# Patient Record
Sex: Female | Born: 1970 | Race: Black or African American | Hispanic: No | Marital: Married | Smoking: Current every day smoker
Health system: Southern US, Community
[De-identification: ages and names within clinical notes are randomized; demographics above are authoritative.]

## PROBLEM LIST (undated history)

## (undated) DIAGNOSIS — R7303 Prediabetes: Secondary | ICD-10-CM

## (undated) DIAGNOSIS — M2550 Pain in unspecified joint: Secondary | ICD-10-CM

## (undated) DIAGNOSIS — G4730 Sleep apnea, unspecified: Secondary | ICD-10-CM

## (undated) DIAGNOSIS — R600 Localized edema: Secondary | ICD-10-CM

## (undated) DIAGNOSIS — M549 Dorsalgia, unspecified: Secondary | ICD-10-CM

## (undated) DIAGNOSIS — M5430 Sciatica, unspecified side: Secondary | ICD-10-CM

## (undated) DIAGNOSIS — A64 Unspecified sexually transmitted disease: Secondary | ICD-10-CM

## (undated) HISTORY — DX: Dorsalgia, unspecified: M54.9

## (undated) HISTORY — DX: Pain in unspecified joint: M25.50

## (undated) HISTORY — DX: Unspecified sexually transmitted disease: A64

## (undated) HISTORY — DX: Sleep apnea, unspecified: G47.30

## (undated) HISTORY — PX: WISDOM TOOTH EXTRACTION: SHX21

## (undated) HISTORY — DX: Localized edema: R60.0

## (undated) HISTORY — PX: ABLATION: SHX5711

---

## 1995-06-09 HISTORY — PX: BREAST SURGERY: SHX581

## 1998-01-15 ENCOUNTER — Other Ambulatory Visit: Admission: RE | Admit: 1998-01-15 | Discharge: 1998-01-15 | Payer: Self-pay | Admitting: Gynecology

## 1998-03-25 ENCOUNTER — Emergency Department (HOSPITAL_COMMUNITY): Admission: EM | Admit: 1998-03-25 | Discharge: 1998-03-25 | Payer: Self-pay | Admitting: Emergency Medicine

## 1998-03-29 ENCOUNTER — Ambulatory Visit (HOSPITAL_COMMUNITY): Admission: RE | Admit: 1998-03-29 | Discharge: 1998-03-29 | Payer: Self-pay | Admitting: Gynecology

## 1998-04-01 ENCOUNTER — Ambulatory Visit (HOSPITAL_COMMUNITY): Admission: RE | Admit: 1998-04-01 | Discharge: 1998-04-01 | Payer: Self-pay | Admitting: Gynecology

## 1999-09-01 ENCOUNTER — Emergency Department (HOSPITAL_COMMUNITY): Admission: EM | Admit: 1999-09-01 | Discharge: 1999-09-01 | Payer: Self-pay | Admitting: Emergency Medicine

## 2000-06-03 ENCOUNTER — Emergency Department (HOSPITAL_COMMUNITY): Admission: EM | Admit: 2000-06-03 | Discharge: 2000-06-03 | Payer: Self-pay | Admitting: *Deleted

## 2000-07-20 ENCOUNTER — Other Ambulatory Visit: Admission: RE | Admit: 2000-07-20 | Discharge: 2000-07-20 | Payer: Self-pay | Admitting: Obstetrics and Gynecology

## 2000-09-09 ENCOUNTER — Other Ambulatory Visit: Admission: RE | Admit: 2000-09-09 | Discharge: 2000-09-09 | Payer: Self-pay | Admitting: Plastic Surgery

## 2000-09-09 ENCOUNTER — Encounter (INDEPENDENT_AMBULATORY_CARE_PROVIDER_SITE_OTHER): Payer: Self-pay | Admitting: Specialist

## 2000-11-22 ENCOUNTER — Inpatient Hospital Stay (HOSPITAL_COMMUNITY): Admission: AD | Admit: 2000-11-22 | Discharge: 2000-11-22 | Payer: Self-pay | Admitting: Obstetrics and Gynecology

## 2001-05-13 ENCOUNTER — Observation Stay (HOSPITAL_COMMUNITY): Admission: EM | Admit: 2001-05-13 | Discharge: 2001-05-13 | Payer: Self-pay | Admitting: Emergency Medicine

## 2003-01-05 ENCOUNTER — Encounter: Payer: Self-pay | Admitting: Family Medicine

## 2006-01-21 ENCOUNTER — Ambulatory Visit: Payer: Self-pay | Admitting: Family Medicine

## 2006-01-21 ENCOUNTER — Encounter (INDEPENDENT_AMBULATORY_CARE_PROVIDER_SITE_OTHER): Payer: Self-pay | Admitting: *Deleted

## 2006-01-21 ENCOUNTER — Other Ambulatory Visit: Admission: RE | Admit: 2006-01-21 | Discharge: 2006-01-21 | Payer: Self-pay | Admitting: Family Medicine

## 2007-02-23 ENCOUNTER — Ambulatory Visit: Payer: Self-pay | Admitting: Family Medicine

## 2007-03-02 LAB — CONVERTED CEMR LAB
ALT: 22 units/L (ref 0–35)
Bilirubin, Direct: 0.1 mg/dL (ref 0.0–0.3)
CO2: 27 meq/L (ref 19–32)
Calcium: 8.8 mg/dL (ref 8.4–10.5)
Eosinophils Absolute: 0.2 10*3/uL (ref 0.0–0.6)
Eosinophils Relative: 2.3 % (ref 0.0–5.0)
GFR calc Af Amer: 104 mL/min
GFR calc non Af Amer: 86 mL/min
Glucose, Bld: 92 mg/dL (ref 70–99)
HCT: 39.7 % (ref 36.0–46.0)
HDL: 29.6 mg/dL — ABNORMAL LOW (ref >39.0)
Hemoglobin: 12.6 g/dL (ref 12.0–15.0)
MCV: 67.9 fL — ABNORMAL LOW (ref 78.0–100.0)
Monocytes Absolute: 0.5 10*3/uL (ref 0.2–0.7)
Neutrophils Relative %: 56.9 % (ref 43.0–77.0)
Platelets: 366 10*3/uL (ref 150–400)
Potassium: 3.7 meq/L (ref 3.5–5.1)
RDW: 15.8 % — ABNORMAL HIGH (ref 11.5–14.6)
Sodium: 143 meq/L (ref 135–145)
VLDL: 14 mg/dL (ref 0–40)

## 2007-03-03 ENCOUNTER — Telehealth (INDEPENDENT_AMBULATORY_CARE_PROVIDER_SITE_OTHER): Payer: Self-pay | Admitting: *Deleted

## 2007-03-28 ENCOUNTER — Ambulatory Visit: Payer: Self-pay | Admitting: Family Medicine

## 2007-03-28 ENCOUNTER — Other Ambulatory Visit: Admission: RE | Admit: 2007-03-28 | Discharge: 2007-03-28 | Payer: Self-pay | Admitting: Family Medicine

## 2007-03-28 ENCOUNTER — Encounter: Payer: Self-pay | Admitting: Family Medicine

## 2007-04-04 ENCOUNTER — Telehealth (INDEPENDENT_AMBULATORY_CARE_PROVIDER_SITE_OTHER): Payer: Self-pay | Admitting: *Deleted

## 2007-04-05 ENCOUNTER — Encounter: Payer: Self-pay | Admitting: Family Medicine

## 2007-04-06 ENCOUNTER — Telehealth: Payer: Self-pay | Admitting: Family Medicine

## 2007-07-06 ENCOUNTER — Encounter: Payer: Self-pay | Admitting: Family Medicine

## 2007-07-07 ENCOUNTER — Ambulatory Visit (HOSPITAL_COMMUNITY): Admission: RE | Admit: 2007-07-07 | Discharge: 2007-07-07 | Payer: Self-pay | Admitting: Surgery

## 2007-07-13 ENCOUNTER — Ambulatory Visit (HOSPITAL_COMMUNITY): Admission: RE | Admit: 2007-07-13 | Discharge: 2007-07-13 | Payer: Self-pay | Admitting: Surgery

## 2007-07-20 ENCOUNTER — Encounter: Admission: RE | Admit: 2007-07-20 | Discharge: 2007-07-20 | Payer: Self-pay | Admitting: Surgery

## 2007-08-15 ENCOUNTER — Encounter: Payer: Self-pay | Admitting: Family Medicine

## 2007-09-02 ENCOUNTER — Encounter: Payer: Self-pay | Admitting: Family Medicine

## 2007-12-01 ENCOUNTER — Encounter: Admission: RE | Admit: 2007-12-01 | Discharge: 2008-02-28 | Payer: Self-pay | Admitting: Surgery

## 2007-12-19 ENCOUNTER — Ambulatory Visit (HOSPITAL_COMMUNITY): Admission: RE | Admit: 2007-12-19 | Discharge: 2007-12-20 | Payer: Self-pay | Admitting: Surgery

## 2008-05-25 ENCOUNTER — Encounter: Payer: Self-pay | Admitting: Family Medicine

## 2008-06-08 HISTORY — PX: LAPAROSCOPIC GASTRIC BANDING: SHX1100

## 2009-10-07 ENCOUNTER — Ambulatory Visit: Payer: Self-pay | Admitting: Family Medicine

## 2009-10-07 ENCOUNTER — Encounter (INDEPENDENT_AMBULATORY_CARE_PROVIDER_SITE_OTHER): Payer: Self-pay | Admitting: *Deleted

## 2009-10-07 ENCOUNTER — Other Ambulatory Visit: Admission: RE | Admit: 2009-10-07 | Discharge: 2009-10-07 | Payer: Self-pay | Admitting: Family Medicine

## 2009-10-07 DIAGNOSIS — Z9884 Bariatric surgery status: Secondary | ICD-10-CM

## 2009-10-07 DIAGNOSIS — N898 Other specified noninflammatory disorders of vagina: Secondary | ICD-10-CM | POA: Insufficient documentation

## 2009-10-07 LAB — CONVERTED CEMR LAB
Bilirubin Urine: NEGATIVE
Nitrite: NEGATIVE
Protein, U semiquant: NEGATIVE
Urobilinogen, UA: 0.2
WBC Urine, dipstick: NEGATIVE

## 2009-10-10 ENCOUNTER — Encounter (INDEPENDENT_AMBULATORY_CARE_PROVIDER_SITE_OTHER): Payer: Self-pay | Admitting: *Deleted

## 2009-10-10 LAB — CONVERTED CEMR LAB: Pap Smear: NEGATIVE

## 2009-11-11 ENCOUNTER — Ambulatory Visit: Payer: Self-pay | Admitting: Family Medicine

## 2010-06-29 ENCOUNTER — Encounter: Payer: Self-pay | Admitting: Family Medicine

## 2010-07-08 NOTE — Letter (Signed)
Summary: Results Follow up Letter  [place] [facility]  [address] [place], [place] [address]   Phone: [phone]  Fax: [phone]    10/10/2009 MRN: [id number]  [facility] [place] [address]  # [place], [place]  [address]  Dear Ms. Griffiths,  The following are the results of your recent test(s):  Test         Result    Pap Smear:        Normal __X___  Not Normal _____ Comments: ______________________________________________________ Cholesterol: LDL(Bad cholesterol):         Your goal is less than:         HDL (Good cholesterol):       Your goal is more than: Comments:  ______________________________________________________ Mammogram:        Normal _____  Not Normal _____ Comments:  ___________________________________________________________________ Hemoccult:        Normal _____  Not normal _______ Comments:    _____________________________________________________________________ Other Tests:    We routinely do not discuss normal results over the telephone.  If you desire a copy of the results, or you have any questions about this information we can discuss them at your next office visit.   Sincerely,    [name] CMA  Oct 10, 2009 9:14 AM

## 2010-07-08 NOTE — Assessment & Plan Note (Signed)
Summary: pap/cpx/kdc   Vital Signs:  Patient profile:   40 year old female Height:      67.75 inches Weight:      279 pounds BMI:     42.89 Pulse rate:   87 / minute Pulse rhythm:   regular BP sitting:   126 / 80  (left arm) Cuff size:   regular  Vitals Entered By: [name] CMA (Oct 07, 2009 1:01 PM) CC: Pt here for CPX, pap   History of Present Illness: Pt here for cpe, pap and labs.  Pt with no complaints.  Pt needs to f/u surgeon for lap band.    Preventive Screening-Counseling & Management  Alcohol-Tobacco     Alcohol drinks/day: <1     Alcohol type: wine     Smoking Status: current     Smoking Cessation Counseling: yes     Smoke Cessation Stage: ready     Packs/Day: 0.25     Year Started: 1987     Passive Smoke Exposure: no  Caffeine-Diet-Exercise     Caffeine use/day: 1     Does Patient Exercise: yes     Type of exercise: walking     Times/week: 4  Hep-HIV-STD-Contraception     HIV Risk: no     Dental Visit-last 6 months yes     SBE monthly: yes     SBE Education/Counseling: to perform regular SBE  Safety-Violence-Falls     Seat Belt Use: 100      Sexual History:  currently monogamous.    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 Family History Hypertension Family History Heart disease  Social History: Reviewed history from 02/23/2007 and no changes required. 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 Eyes:  Denies blurring, discharge, double vision, eye irritation, eye pain, halos, itching, light sensitivity, red eye, vision loss-1 eye, and vision loss-both eyes; optho--no. ENT:  Denies decreased hearing, difficulty swallowing, ear discharge, earache, hoarseness, nasal congestion, nosebleeds, postnasal drainage, ringing in ears, sinus pressure, and sore throat. CV:  Denies bluish discoloration of lips or nails, chest pain or discomfort, difficulty breathing at night, difficulty breathing while lying down, fainting, fatigue, leg cramps with exertion, lightheadness, near fainting, palpitations, shortness of breath with exertion, swelling of feet, swelling of hands, and weight gain. Resp:  Denies chest discomfort, chest pain with inspiration, cough, coughing up blood, excessive snoring, hypersomnolence, morning headaches, pleuritic, shortness of breath, sputum productive, and wheezing. GI:  Denies abdominal pain, bloody stools, change in bowel habits, constipation, dark tarry stools, diarrhea, excessive appetite, gas, hemorrhoids, indigestion, loss of appetite, nausea, vomiting, vomiting blood, and  yellowish skin color. GU:  Complains of discharge; denies abnormal vaginal bleeding, decreased libido, dysuria, genital sores, hematuria, incontinence, nocturia, urinary frequency, and urinary hesitancy. MS:  Denies joint pain, joint redness, joint swelling,  loss of strength, low back pain, mid back pain, muscle aches, muscle , cramps, muscle weakness, stiffness, and thoracic pain. Derm:  Denies changes in color of skin, changes in nail beds, dryness, excessive perspiration, flushing, hair loss, insect bite(s), itching, lesion(s), poor wound healing, and rash. Neuro:  Denies brief paralysis, difficulty with concentration, disturbances in coordination, falling down, headaches, inability to speak, memory loss, numbness, poor balance, seizures, sensation of room spinning, tingling, tremors, visual disturbances, and weakness. Psych:  Denies alternate hallucination ( auditory/visual), anxiety, depression, easily angered, easily tearful, irritability, mental problems, panic attacks, sense of great danger, suicidal thoughts/plans, thoughts of violence, unusual visions or sounds, and thoughts /plans of harming others. Endo:  Denies cold intolerance, excessive hunger, excessive thirst, excessive urination, heat intolerance, polyuria, and weight change. Heme:  Denies abnormal bruising, bleeding, enlarge lymph nodes, fevers, pallor, and skin discoloration. Allergy:  Denies hives or rash, itching eyes, persistent infections, seasonal allergies, and sneezing.  Physical Exam  General:  Well-developed,well-nourished,in no acute distress; alert,appropriate and cooperative throughout examination Head:  Normocephalic and atraumatic without obvious abnormalities. No apparent alopecia or balding. Eyes:  vision grossly intact, pupils equal, pupils round, pupils reactive to light, and no injection.   Ears:  External ear exam shows no significant lesions or deformities.  Otoscopic examination reveals clear canals, tympanic membranes are intact bilaterally without bulging, retraction, inflammation or discharge. Hearing is grossly normal bilaterally. Nose:  External nasal examination shows no deformity or inflammation. Nasal mucosa are pink and moist without lesions or  exudates. Mouth:  Oral mucosa and oropharynx without lesions or exudates.  Teeth in good repair. Neck:  No deformities, masses, or tenderness noted. Chest Wall:  No deformities, masses, or tenderness noted. Breasts:  No mass, nodules, thickening, tenderness, bulging, retraction, inflamation, nipple discharge or skin changes noted.   Lungs:  Normal respiratory effort, chest expands symmetrically. Lungs are clear to auscultation, no crackles or wheezes. Heart:  normal rate and no murmur.   Abdomen:  Bowel sounds positive,abdomen soft and non-tender without masses, organomegaly or hernias noted. Rectal:  No external abnormalities noted. Normal sphincter tone. No rectal masses or tenderness. Genitalia:  Pelvic Exam:        External: normal female genitalia without lesions or masses        Vagina: normal without lesions or masses        Cervix: normal without lesions or masses        Adnexa: normal bimanual exam without masses or fullness        Uterus: normal by palpation        Pap smear: performed Msk:  normal ROM, no joint tenderness, no joint swelling, no joint warmth, no redness over joints, no joint deformities, no joint instability, and no crepitation.   Pulses:  R posterior tibial normal, R dorsalis pedis normal, R carotid normal, L posterior tibial normal, L dorsalis pedis normal, and L carotid normal.   Extremities:  No clubbing, cyanosis, edema, or deformity noted with normal full range of motion of all joints.   Neurologic:  No cranial nerve deficits noted. Station and gait are normal. Plantar reflexes are down-going bilaterally. DTRs are symmetrical throughout. Sensory, motor and coordinative functions appear intact. Skin:  Intact without suspicious lesions or rashes Cervical Nodes:  No lymphadenopathy noted Axillary  Nodes:  No palpable lymphadenopathy Psych:  Cognition and judgment appear intact. Alert and cooperative with normal attention span and concentration. No apparent  delusions, illusions, hallucinations   Impression & Recommendations:  Problem # 1:  PREVENTIVE HEALTH CARE (ICD-V70.0) ghm utd check fasting labs check mammo Orders: Radiology Referral (Radiology) EKG w/ Interpretation (93000) UA Dipstick w/o Micro (manual) (91478)  Problem # 2:  VAGINAL DISCHARGE (ICD-623.5)  check labs and gc/ chlamydia Orders: Venipuncture (29562) EKG w/ Interpretation (93000) UA Dipstick w/o Micro (manual) (13086)  Discussed medication use and symptom relief.   Problem # 3:  BARIATRIC SURGERY STATUS (ICD-V45.86)  Orders: EKG w/ Interpretation (93000) UA Dipstick w/o Micro (manual) (57846)  Patient Instructions: 1)  V70.0 ,  V45.86   cbcd, hep ,lipid , TSH, b12, vita D, bmp, ibc, ferritin    EKG  Procedure date:  10/07/2009  Findings:      Normal sinus rhythm with rate of:  88   Flu Vaccine Next Due:  Not Indicated    Laboratory Results   Urine Tests    Routine Urinalysis   Color: yellow Appearance: Clear Glucose: negative   (Normal Range: Negative) Bilirubin: negative   (Normal Range: Negative) Ketone: negative   (Normal Range: Negative) Spec. Gravity: 1.015   (Normal Range: 1.003-1.035) Blood: negative   (Normal Range: Negative) pH: 6.5   (Normal Range: 5.0-8.0) Protein: negative   (Normal Range: Negative) Urobilinogen: 0.2   (Normal Range: 0-1) Nitrite: negative   (Normal Range: Negative) Leukocyte Esterace: negative   (Normal Range: Negative)    Comments: [name] CMA  Oct 07, 2009 2:21 PM

## 2010-07-08 NOTE — Letter (Signed)
Summary: [facility]: Immunoassay Fecal Occult Blood (iFOB) Order Form  [facility] [place]  [address] [place], [place] [address]   Phone: [phone]  Fax: [phone]      [facility]: Immunoassay Fecal Occult Blood (iFOB) Order Form   Oct 07, 2009 MRN: [id number]   [facility] Michaela Carr 03-Jul-1970   Physicican Name:___Yvonne Laury Axon, DO______________________  Diagnosis Code:_____v76.51_____________________      [name] CMA

## 2010-10-21 NOTE — Op Note (Signed)
NAMESKYANN, Michaela Carr                 ACCOUNT NO.:  [id number]   MEDICAL RECORD NO.:  [id number]          PATIENT TYPE:  AMB   LOCATION:  DAY                          FACILITY:  [facility]   PHYSICIAN:  Michaela Carr, M.D.  DATE OF BIRTH:  1970/07/09   DATE OF PROCEDURE:  12/19/2007  DATE OF DISCHARGE:                               OPERATIVE REPORT   Date of Surgery ??   PREOPERATIVE DIAGNOSIS:  Morbid obesity (weight 311, BMI 48.8).   POSTOPERATIVE DIAGNOSIS:  Morbid obesity (weight 311, BMI 48.8).   PROCEDURES:  Lap band (AP large).   SURGEON:  Michaela Carr, M.D.   FIRST ASSISTANT:  Alfonse Ras, M.D.   ANESTHESIA:  General LMA.   ESTIMATED BLOOD LOSS:  Minimal.   PROCEDURE:  Michaela Carr is a 40 year old black female who is a patient of  Michaela Carr who has been overweight much of her adult life.  She  has been through our preop bariatric program and interested in having a  lap band placement.   The indication, potential complications of the lap band placement were  explained to the patient.  Potential complications include but not  limited to bleeding, infection, perforation of bowel, slippage of the  band,  erosion of the band, and long-term nutritional consequences.   OPERATIVE NOTE:  The patient placed in a supine position, given a  general endotracheal anesthetic.  She had an NG tube in place, a Foley  catheter in place, PAS stockings.  She was given a gram of Ancef at  initiation of the procedure.  Her abdomen was prepped with Techni-Care  and a time-out was held, identifying the patient and the procedure.   I accessed the abdominal cavity with 11-mm Optiview in the left upper  quadrant.  I placed five additional trocars.  I placed a 5-mm subxiphoid  trocar for the liver retractor, a 15-mm right subcostal for the band  placement into the abdomen, an 11-mm right paramedian incision, an 11-mm  left paramedian for scope placement, and a 5-mm lateral abdominal  trocar.   Abdominal exploration revealed the bowel that I could see was  unremarkable, the omentum was unremarkable.  Both lobes of liver had  some fatty changes but was otherwise unremarkable.  Anterior wall of the  stomach was unremarkable.   As the dissection started, I dissected along the angle of His at the  gastroesophageal junction; this is on the left side.  I then opened up  at the gastrohepatic ligament and I went medially to the inside of the  right crus to create a hole to pass the finger around.   Before doing that, we placed the balloon sizer down and blew it up to 15  mL of air and pulled this back.  This cinched at the gastroesophageal  junction; there was no evidence of hiatal hernia.   I then took the finger dissector and passed it around behind the  gastroesophageal junction.  Because of the patient's size being a BMI of  48.8 and the amount of fat she had in her  upper abdomen, I used an AP  large band.   This was passed around the gastroesophageal junction and locked down.  I  then imbricated the stomach along the anterior lateral portion of the  band using 0 Ethibond sutures and I used a tie knot to cinch this down.  There were three sutures placed and then I put an antislip stitch along  the lesser curvature side.   Photos were taken and placed in the chart.  The band lay in good  position.  There was no compromise of the bowel that I could see.  The  band could easily be moved back and forth without any entrapment.   The tubing was then removed from the abdominal wall.  The trocars were  then all removed in turn.  I developed a pocket in the right upper  quadrant for the reservoir.  The tubing was then attached to the  reservoir and was sewn in place with a 2-0 Prolene suture.  This lay  flat against the abdominal fascia.  I placed a subcutaneous stitch over  the fascia and then a 5-0 subcuticular Monocryl suture at each wound  site.  The wounds were  painted with tincture of benzoin and steri-  stripped.   The patient tolerated the procedure well, was transported to the  recovery room in good condition.  Sponge and needle counts were correct  at the end of the case.      Michaela Carr, M.D.  Electronically Signed     DHN/MEDQ  D:  12/19/2007  T:  12/19/2007  Job:  [id number]   cc:   Lelon Perla, DO  [address] [place], [place] [address]

## 2010-10-21 NOTE — Letter (Signed)
April 06, 2007    [place]. Daphine Deutscher, MD  [address]  [place], [place] [address]   RE:  Michaela Carr, Michaela Carr  MRN:  [id number]  /  DOB:  16-Feb-1971   Dear Dr. Daphine Deutscher,   Michaela Carr has come to me with an interest in having lap band surgery.  She  has been to an informational meeting at [facility] and has  done research on her own, as far as the gastric bypass and lap band  surgery.  She is fully aware of the risks of undergoing the procedure  and understands the diet changes and lifestyle modifications that need  to take place in conjunction with the surgery.  She has a lifelong  history of being overweight.  Over the last five years she has been on  Adipex, that was last year, for a month, from which she had no  significant results.  She has tried [employer], low carb diets, Alexside and grapefruit diets, each over the last several years.  She has  tried one or several of them,  has lost 5-10 pounds with each one and  gained that back plus several pounds.   In 2004, she weighed 267 pounds with a BMI of 41.8.  In 2005, she was  20 with a BMI of 44.5.  In 2006, she was 278 with a BMI of 43.5.  In  2007, she was 280.8 with a BMI of 44.  In 2008, her weight was at her  highest at 302.4 with a BMI of 47.5.  She is 5 feet 7 inches tall.   She has been working on diet and exercise and is struggling to get the  pounds off.  She does not have any other medical problems.  There is a  family history of hypertension.   I believe Michaela Carr is a good candidate for the surgery.  Please feel free  to call me with any further questions.    Sincerely,      Lelon Perla, DO  Electronically Signed    Shawnie Dapper  DD: 04/06/2007  DT: 04/06/2007  Job #: [id number]

## 2011-03-05 LAB — DIFFERENTIAL
Basophils Relative: 0
Eosinophils Absolute: 0.2
Eosinophils Relative: 2
Lymphocytes Relative: 28
Monocytes Relative: 7
Neutrophils Relative %: 63

## 2011-03-05 LAB — CBC
HCT: 35.5 — ABNORMAL LOW
Hemoglobin: 11 — ABNORMAL LOW
RBC: 5.23 — ABNORMAL HIGH
WBC: 8.4

## 2011-03-05 LAB — HEMOGLOBIN AND HEMATOCRIT, BLOOD: HCT: 38.2

## 2011-03-05 LAB — PREGNANCY, URINE: Preg Test, Ur: NEGATIVE

## 2011-07-09 ENCOUNTER — Telehealth (INDEPENDENT_AMBULATORY_CARE_PROVIDER_SITE_OTHER): Payer: Self-pay | Admitting: Surgery

## 2011-07-09 NOTE — Telephone Encounter (Signed)
07/09/11 recall letter mailed to patient for bariatric surgery follow-up. Adv pt to call our office at [phone] to schedule an appointment. CEF °

## 2012-05-27 ENCOUNTER — Emergency Department (HOSPITAL_COMMUNITY)
Admission: EM | Admit: 2012-05-27 | Discharge: 2012-05-27 | Disposition: A | Discharge status: Home / Self Care | Payer: Self-pay | Attending: Emergency Medicine | Admitting: Emergency Medicine

## 2012-05-27 ENCOUNTER — Encounter (HOSPITAL_COMMUNITY): Payer: Self-pay

## 2012-05-27 DIAGNOSIS — F17200 Nicotine dependence, unspecified, uncomplicated: Secondary | ICD-10-CM | POA: Insufficient documentation

## 2012-05-27 DIAGNOSIS — K089 Disorder of teeth and supporting structures, unspecified: Secondary | ICD-10-CM | POA: Insufficient documentation

## 2012-05-27 DIAGNOSIS — K0889 Other specified disorders of teeth and supporting structures: Secondary | ICD-10-CM

## 2012-05-27 MED ORDER — OXYCODONE-ACETAMINOPHEN 5-325 MG PO TABS: 1.0000 | ORAL_TABLET | Freq: Four times a day (QID) | ORAL | Status: DC | PRN

## 2012-05-27 MED ORDER — IBUPROFEN 400 MG PO TABS
800.0000 mg | ORAL_TABLET | Freq: Once | ORAL | Status: AC
  Administered 2012-05-27: 800 mg via ORAL
  Filled 2012-05-27: qty 2

## 2012-05-27 NOTE — ED Provider Notes (Signed)
Medical screening examination/treatment/procedure(s) were performed by non-physician practitioner and as supervising physician I was immediately available for consultation/collaboration.  Nasser Ku, MD 05/27/12 1946 

## 2012-05-27 NOTE — ED Notes (Signed)
Pt presents with 6 month h/o R sided teeth pain, to upper and lower areas.  Pt reports pain worsened x 2-3 days ago, reports OTC medication does not help.

## 2012-05-27 NOTE — ED Provider Notes (Signed)
History     CSN: [id number]  Arrival date & time 05/27/12  1208   First MD Initiated Contact with Patient 05/27/12 1300      Chief Complaint  Patient presents with  . Dental Pain    (Consider location/radiation/quality/duration/timing/severity/associated sxs/prior treatment) HPI Comments: Patient presents to the emergency department with a dental complaint. Symptoms began 6 months ago- intermittent pain. The patient has tried to alleviate pain with OTC pain relievers.  Pain rated at a 10/10, characterized as throbbing in nature and located right upper and lower molar region. Patient denies fever, night sweats, chills, difficulty swallowing or opening mouth, SOB, nuchal rigidity or decreased ROM of neck.  Patient does not have a dentist and requests a resource guide at discharge.   Patient is a 41 y.o. female presenting with tooth pain. The history is provided by the patient.  Dental PainPrimary symptoms do not include headaches, fever, shortness of breath or sore throat.  Additional symptoms do not include: facial swelling, trouble swallowing, drooling and ear pain.    History reviewed. No pertinent past medical history.  History reviewed. No pertinent past surgical history.  History reviewed. No pertinent family history.  History  Substance Use Topics  . Smoking status: Current Some Day Smoker -- 0.5 packs/day  . Smokeless tobacco: Not on file  . Alcohol Use: Yes    OB History    Grav Para Term Preterm Abortions TAB SAB Ect Mult Living                  Review of Systems  Constitutional: Negative for fever, chills, diaphoresis and activity change.  HENT: Positive for dental problem. Negative for ear pain, sore throat, facial swelling, drooling, mouth sores, trouble swallowing, neck pain, neck stiffness, voice change, sinus pressure and tinnitus.   Eyes: Negative for pain and visual disturbance.  Respiratory: Negative for shortness of breath, wheezing and stridor.    Cardiovascular: Negative for chest pain.  Gastrointestinal: Negative for nausea and abdominal pain.  Musculoskeletal: Negative for myalgias.  Skin: Negative for rash.  Neurological: Negative for speech difficulty and headaches.  Hematological: Negative for adenopathy.  All other systems reviewed and are negative.    Allergies  Review of patient's allergies indicates no known allergies.  Home Medications   Current Outpatient Rx  Name  Route  Sig  Dispense  Refill  . MULTI-VITAMIN/MINERALS PO TABS   Oral   Take 1 tablet by mouth daily.           BP 139/86  Pulse 67  Temp 98 F (36.7 C) (Oral)  Resp 18  SpO2 98%  LMP 05/12/2012  Physical Exam  Nursing note and vitals reviewed. Constitutional: She is oriented to person, place, and time. She appears well-developed and well-nourished. No distress.  HENT:  Head: Normocephalic and atraumatic. No trismus in the jaw.  Mouth/Throat: Uvula is midline, oropharynx is clear and moist and mucous membranes are normal. No oral lesions. No dental abscesses or uvula swelling. No oropharyngeal exudate, posterior oropharyngeal edema, posterior oropharyngeal erythema or tonsillar abscesses.       Normal dental hygiene. Pt able to open and close mouth with out difficulty. Airway intact. Uvula midline. No gingival swelling, fluctuance or tenderness over affected area. No swelling or tenderness of submental and submandibular regions.  Eyes: Conjunctivae normal and EOM are normal.  Neck: Normal range of motion and full passive range of motion without pain. Neck supple.  Cardiovascular: Normal rate and regular rhythm.   Pulmonary/Chest:  Effort normal and breath sounds normal. No stridor. No respiratory distress. She has no wheezes.  Musculoskeletal: Normal range of motion.  Lymphadenopathy:       Head (right side): No submental, no submandibular, no tonsillar, no preauricular and no posterior auricular adenopathy present.       Head (left side):  No submental, no submandibular, no tonsillar, no preauricular and no posterior auricular adenopathy present.    She has no cervical adenopathy.  Neurological: She is alert and oriented to person, place, and time.  Skin: Skin is warm and dry. No rash noted. She is not diaphoretic.    ED Course  Procedures (including critical care time)  Labs Reviewed - No data to display No results found.   No diagnosis found.    MDM  Patient with toothache.  Pt afebrile in NAD. Likely etiology dental carries. No gross abscess or exposed pulp.  Exam unconcerning for Ludwig's angina or spread of infection.  Abx not indicated at this time. Will dc w pain medicine.  Urged patient to follow-up with dentist. Strict return precautions discussed         Jaci Carrel, PA-C 05/27/12 1318

## 2012-06-03 ENCOUNTER — Encounter (HOSPITAL_COMMUNITY): Payer: Self-pay | Admitting: Emergency Medicine

## 2012-06-03 ENCOUNTER — Emergency Department (HOSPITAL_COMMUNITY)
Admission: EM | Admit: 2012-06-03 | Discharge: 2012-06-03 | Disposition: A | Discharge status: Home / Self Care | Payer: Self-pay | Attending: Emergency Medicine | Admitting: Emergency Medicine

## 2012-06-03 DIAGNOSIS — F17200 Nicotine dependence, unspecified, uncomplicated: Secondary | ICD-10-CM | POA: Insufficient documentation

## 2012-06-03 DIAGNOSIS — K029 Dental caries, unspecified: Secondary | ICD-10-CM | POA: Insufficient documentation

## 2012-06-03 DIAGNOSIS — K089 Disorder of teeth and supporting structures, unspecified: Secondary | ICD-10-CM | POA: Insufficient documentation

## 2012-06-03 DIAGNOSIS — K0889 Other specified disorders of teeth and supporting structures: Secondary | ICD-10-CM

## 2012-06-03 MED ORDER — IBUPROFEN 800 MG PO TABS: 800.0000 mg | ORAL_TABLET | Freq: Three times a day (TID) | ORAL | Status: DC | PRN

## 2012-06-03 NOTE — ED Notes (Signed)
Pt reports 10/10 pain to upper and lower back molars to right side, as well as cracked tooth to lower back molar. A&Ox4, ambulatory, nad. Requests another referral to a dentist that will bill the hospital. Pt's mother was referred to a dentist in the past on [address] who she states billed the hospital.

## 2012-06-03 NOTE — ED Notes (Signed)
Pt returns to ED for same right upper and lower back molar pain. Last visit to ED, pt received a referral to a dentist who requested payment up front. Pt requests a referral to mother's dentist on [address] b/c this dentist will bill hospital. A&Ox4, ambulatory, nad.

## 2012-06-03 NOTE — ED Provider Notes (Signed)
History  This chart was scribed for Michaela Racer, MD by Bennett Scrape, ED Scribe. This patient was seen in room TR04C/TR04C and the patient's care was started at 1:03 PM.    CSN: [id number]  Arrival date & time 06/03/12  1157   First MD Initiated Contact with Patient 06/03/12 1304      Chief Complaint  Patient presents with  . Dental Pain     Patient is a 41 y.o. female presenting with tooth pain. The history is provided by the patient. No language interpreter was used.  Dental PainPrimary symptoms do not include fever, shortness of breath or cough. The symptoms are unchanged. The symptoms are new. The symptoms occur constantly.  Additional symptoms do not include: trouble swallowing and ear pain.    Michaela Carr is a 41 y.o. female who presents to the Emergency Department complaining of 6 months of non-changing, intermittent dental pain located in the right upper and lower back molar pain. She was seen in the ED for the same and was given a referral to a dentist who wanted her to pay up front. She was given percocet with no improvement and has been taking 800 mg ibuprofen with moderate relief. She denies being prescribed antibiotics. She is requsting a referral to Dr. Melynda Ripple, off of [address] to follow up with. She denies fevers, chills, nausea and emesis as associated symptoms. She does not have a h/o chronic medical conditions. She is a current everyday smoker and occasional alcohol user.   History reviewed. No pertinent past medical history.  History reviewed. No pertinent past surgical history.  No family history on file.  History  Substance Use Topics  . Smoking status: Current Some Day Smoker -- 0.5 packs/day  . Smokeless tobacco: Not on file  . Alcohol Use: Yes    No OB history provided.  Review of Systems  Constitutional: Negative for fever and chills.  HENT: Positive for dental problem. Negative for ear pain and trouble swallowing.   Respiratory: Negative  for cough and shortness of breath.   Gastrointestinal: Negative for vomiting and diarrhea.  All other systems reviewed and are negative.    Allergies  Review of patient's allergies indicates no known allergies.  Home Medications   Current Outpatient Rx  Name  Route  Sig  Dispense  Refill  . GOODY HEADACHE PO   Oral   Take 1 packet by mouth daily as needed. For pain         . MULTI-VITAMIN/MINERALS PO TABS   Oral   Take 1 tablet by mouth daily.         Marland Kitchen NAPROXEN SODIUM 220 MG PO TABS   Oral   Take 440 mg by mouth 2 (two) times daily with a meal. For tooth pain.         . IBUPROFEN 800 MG PO TABS   Oral   Take 1 tablet (800 mg total) by mouth every 8 (eight) hours as needed for pain.   30 tablet   0   . OXYCODONE-ACETAMINOPHEN 5-325 MG PO TABS   Oral   Take 1 tablet by mouth every 6 (six) hours as needed.           Triage Vitals: BP 131/81  Pulse 71  Temp 97.1 F (36.2 C) (Oral)  Resp 18  SpO2 97%  LMP 04/12/2012  Physical Exam  Nursing note and vitals reviewed. Constitutional: She is oriented to person, place, and time. She appears well-developed and well-nourished. No  distress.  HENT:  Head: Normocephalic and atraumatic.  Mouth/Throat: Oropharynx is clear and moist.       Scattered dental caries to the right superior and inferior molars, no masses, no tenderness to percussion to the teeth, no inflammation   Eyes: Conjunctivae normal and EOM are normal. Pupils are equal, round, and reactive to light.  Neck: Neck supple. No tracheal deviation present.  Cardiovascular: Normal rate.   Pulmonary/Chest: Effort normal. No respiratory distress.  Musculoskeletal: Normal range of motion.  Neurological: She is alert and oriented to person, place, and time.  Skin: Skin is warm and dry.  Psychiatric: She has a normal mood and affect. Her behavior is normal.    ED Course  Procedures (including critical care time)  DIAGNOSTIC STUDIES: Oxygen Saturation is  97% on room air, adequate by my interpretation.    COORDINATION OF CARE: 1:28 PM- Discussed treatment plan which includes 800 mg ibuprofen with pt at bedside and pt agreed to plan. Will provide a referral to the requested dentist.   1:30 PM- Prescribed 800 mg ibuprofen tablets  Labs Reviewed - No data to display No results found.   1. Pain, dental       MDM  I personally performed the services described in this documentation, which was scribed in my presence. The recorded information has been reviewed and is accurate.    Michaela Racer, MD 06/03/12 [phone]

## 2012-06-24 ENCOUNTER — Telehealth (INDEPENDENT_AMBULATORY_CARE_PROVIDER_SITE_OTHER): Payer: Self-pay | Admitting: Surgery

## 2012-06-24 NOTE — Telephone Encounter (Signed)
06/24/12 left message and mailed recall letter for pt to call [phone] to schedule a bariatric surgery follow-up appt with Dr. Ezzard Standing. Pt hasn't been seen since lap band sx 12/19/07.ls

## 2012-11-28 ENCOUNTER — Encounter: Payer: Self-pay | Admitting: Obstetrics and Gynecology

## 2012-11-28 ENCOUNTER — Ambulatory Visit (INDEPENDENT_AMBULATORY_CARE_PROVIDER_SITE_OTHER): Payer: BC Managed Care – PPO | Admitting: Obstetrics and Gynecology

## 2012-11-28 VITALS — BP 124/78 | HR 76 | Ht 67.0 in | Wt 279.0 lb

## 2012-11-28 DIAGNOSIS — Z113 Encounter for screening for infections with a predominantly sexual mode of transmission: Secondary | ICD-10-CM

## 2012-11-28 DIAGNOSIS — N760 Acute vaginitis: Secondary | ICD-10-CM

## 2012-11-28 DIAGNOSIS — Z01419 Encounter for gynecological examination (general) (routine) without abnormal findings: Secondary | ICD-10-CM

## 2012-11-28 DIAGNOSIS — A499 Bacterial infection, unspecified: Secondary | ICD-10-CM

## 2012-11-28 DIAGNOSIS — Z0000 Encounter for general adult medical examination without abnormal findings: Secondary | ICD-10-CM

## 2012-11-28 LAB — COMPREHENSIVE METABOLIC PANEL
ALT: 14 U/L (ref 0–35)
CO2: 24 mEq/L (ref 19–32)
Calcium: 9.1 mg/dL (ref 8.4–10.5)
Chloride: 107 mEq/L (ref 96–112)
Creat: 0.81 mg/dL (ref 0.50–1.10)
Glucose, Bld: 101 mg/dL — ABNORMAL HIGH (ref 70–99)
Sodium: 137 mEq/L (ref 135–145)
Total Protein: 7.8 g/dL (ref 6.0–8.3)

## 2012-11-28 LAB — POCT URINALYSIS DIPSTICK
Glucose, UA: NEGATIVE
Ketones, UA: NEGATIVE
Leukocytes, UA: NEGATIVE
Protein, UA: NEGATIVE

## 2012-11-28 LAB — STD PANEL: HIV: NONREACTIVE

## 2012-11-28 LAB — CBC
Platelets: 406 10*3/uL — ABNORMAL HIGH (ref 150–400)
RDW: 17.6 % — ABNORMAL HIGH (ref 11.5–15.5)
WBC: 7.5 10*3/uL (ref 4.0–10.5)

## 2012-11-28 LAB — LIPID PANEL
LDL Cholesterol: 118 mg/dL — ABNORMAL HIGH (ref 0–99)
Triglycerides: 84 mg/dL (ref <150)

## 2012-11-28 MED ORDER — METRONIDAZOLE 500 MG PO TABS: 500.0000 mg | ORAL_TABLET | Freq: Two times a day (BID) | ORAL | Status: DC

## 2012-11-28 NOTE — Patient Instructions (Addendum)
EXERCISE AND DIET:  We recommended that you start or continue a regular exercise program for good health. Regular exercise means any activity that makes your heart beat faster and makes you sweat.  We recommend exercising at least 30 minutes per day at least 3 days a week, preferably 4 or 5.  We also recommend a diet low in fat and sugar.  Inactivity, poor dietary choices and obesity can cause diabetes, heart attack, stroke, and kidney damage, among others.    ALCOHOL AND SMOKING:  Women should limit their alcohol intake to no more than 7 drinks/beers/glasses of wine (combined, not each!) per week. Moderation of alcohol intake to this level decreases your risk of breast cancer and liver damage. And of course, no recreational drugs are part of a healthy lifestyle.  And absolutely no smoking or even second hand smoke. Most people know smoking can cause heart and lung diseases, but did you know it also contributes to weakening of your bones? Aging of your skin?  Yellowing of your teeth and nails?  CALCIUM AND VITAMIN D:  Adequate intake of calcium and Vitamin D are recommended.  The recommendations for exact amounts of these supplements seem to change often, but generally speaking 600 mg of calcium (either carbonate or citrate) and 800 units of Vitamin D per day seems prudent. Certain women may benefit from higher intake of Vitamin D.  If you are among these women, your doctor will have told you during your visit.    PAP SMEARS:  Pap smears, to check for cervical cancer or precancers,  have traditionally been done yearly, although recent scientific advances have shown that most women can have pap smears less often.  However, every woman still should have a physical exam from her gynecologist every year. It will include a breast check, inspection of the vulva and vagina to check for abnormal growths or skin changes, a visual exam of the cervix, and then an exam to evaluate the size and shape of the uterus and  ovaries.  And after 43 years of age, a rectal exam is indicated to check for rectal cancers. We will also provide age appropriate advice regarding health maintenance, like when you should have certain vaccines, screening for sexually transmitted diseases, bone density testing, colonoscopy, mammograms, etc.   MAMMOGRAMS:  All women over 40 years old should have a yearly mammogram. Many facilities now offer a "3D" mammogram, which may cost around $50 extra out of pocket. If possible,  we recommend you accept the option to have the 3D mammogram performed.  It both reduces the number of women who will be called back for extra views which then turn out to be normal, and it is better than the routine mammogram at detecting truly abnormal areas.    COLONOSCOPY:  Colonoscopy to screen for colon cancer is recommended for all women at age 50.  We know, you hate the idea of the prep.  We agree, BUT, having colon cancer and not knowing it is worse!!  Colon cancer so often starts as a polyp that can be seen and removed at colonscopy, which can quite literally save your life!  And if your first colonoscopy is normal and you have no family history of colon cancer, most women don't have to have it again for 10 years.  Once every ten years, you can do something that may end up saving your life, right?  We will be happy to help you get it scheduled when you are ready.    Be sure to check your insurance coverage so you understand how much it will cost.  It may be covered as a preventative service at no cost, but you should check your particular policy.    Levonorgestrel intrauterine device (IUD) What is this medicine? LEVONORGESTREL IUD (LEE voe nor jes trel) is a contraceptive (birth control) device. The device is placed inside the uterus by a healthcare professional. It is used to prevent pregnancy and can also be used to treat heavy bleeding that occurs during your period. Depending on the device, it can be used for 3 to 5  years. This medicine may be used for other purposes; ask your health care provider or pharmacist if you have questions. What should I tell my health care provider before I take this medicine? They need to know if you have any of these conditions: -abnormal Pap smear -cancer of the breast, uterus, or cervix -diabetes -endometritis -genital or pelvic infection now or in the past -have more than one sexual partner or your partner has more than one partner -heart disease -history of an ectopic or tubal pregnancy -immune system problems -IUD in place -liver disease or tumor -problems with blood clots or take blood-thinners -use intravenous drugs -uterus of unusual shape -vaginal bleeding that has not been explained -an unusual or allergic reaction to levonorgestrel, other hormones, silicone, or polyethylene, medicines, foods, dyes, or preservatives -pregnant or trying to get pregnant -breast-feeding How should I use this medicine? This device is placed inside the uterus by a health care professional. Talk to your pediatrician regarding the use of this medicine in children. Special care may be needed. Overdosage: If you think you have taken too much of this medicine contact a poison control center or emergency room at once. NOTE: This medicine is only for you. Do not share this medicine with others. What if I miss a dose? This does not apply. What may interact with this medicine? Do not take this medicine with any of the following medications: -amprenavir -bosentan -fosamprenavir This medicine may also interact with the following medications: -aprepitant -barbiturate medicines for inducing sleep or treating seizures -bexarotene -griseofulvin -medicines to treat seizures like carbamazepine, ethotoin, felbamate, oxcarbazepine, phenytoin, topiramate -modafinil -pioglitazone -rifabutin -rifampin -rifapentine -some medicines to treat HIV infection like atazanavir, indinavir,  lopinavir, nelfinavir, tipranavir, ritonavir -St. John's wort -warfarin This list may not describe all possible interactions. Give your health care provider a list of all the medicines, herbs, non-prescription drugs, or dietary supplements you use. Also tell them if you smoke, drink alcohol, or use illegal drugs. Some items may interact with your medicine. What should I watch for while using this medicine? Visit your doctor or health care professional for regular check ups. See your doctor if you or your partner has sexual contact with others, becomes HIV positive, or gets a sexual transmitted disease. This product does not protect you against HIV infection (AIDS) or other sexually transmitted diseases. You can check the placement of the IUD yourself by reaching up to the top of your vagina with clean fingers to feel the threads. Do not pull on the threads. It is a good habit to check placement after each menstrual period. Call your doctor right away if you feel more of the IUD than just the threads or if you cannot feel the threads at all. The IUD may come out by itself. You may become pregnant if the device comes out. If you notice that the IUD has come out use a backup birth  control method like condoms and call your health care provider. Using tampons will not change the position of the IUD and are okay to use during your period. What side effects may I notice from receiving this medicine? Side effects that you should report to your doctor or health care professional as soon as possible: -allergic reactions like skin rash, itching or hives, swelling of the face, lips, or tongue -fever, flu-like symptoms -genital sores -high blood pressure -no menstrual period for 6 weeks during use -pain, swelling, warmth in the leg -pelvic pain or tenderness -severe or sudden headache -signs of pregnancy -stomach cramping -sudden shortness of breath -trouble with balance, talking, or walking -unusual  vaginal bleeding, discharge -yellowing of the eyes or skin Side effects that usually do not require medical attention (report to your doctor or health care professional if they continue or are bothersome): -acne -breast pain -change in sex drive or performance -changes in weight -cramping, dizziness, or faintness while the device is being inserted -headache -irregular menstrual bleeding within first 3 to 6 months of use -nausea This list may not describe all possible side effects. Call your doctor for medical advice about side effects. You may report side effects to FDA at 1-800-FDA-1088. Where should I keep my medicine? This does not apply. NOTE: This sheet is a summary. It may not cover all possible information. If you have questions about this medicine, talk to your doctor, pharmacist, or health care provider.  2013, [employer]. (06/25/2011 1:54:04 PM) Prenatal Care  WHAT IS PRENATAL CARE?  Prenatal care means health care during your pregnancy, before your baby is born. Take care of yourself and your baby by:   Getting early prenatal care. If you know you are pregnant, or think you might be pregnant, call your caregiver as soon as possible. Schedule a visit for a general/prenatal examination.  Getting regular prenatal care. Follow your caregiver's schedule for blood and other necessary tests. Do not miss appointments.  Do everything you can to keep yourself and your baby healthy during your pregnancy.  Prenatal care should include evaluation of medical, dietary, educational, psychological, and social needs for the couple and the medical, surgical, and genetic history of the family of the mother and father.  Discuss with your caregiver:  Your medicines, prescription, over-the-counter, and herbal medicines.  Substance abuse, alcohol, smoking, and illegal drugs.  Domestic abuse and violence, if present.  Your immunizations.  Nutrition and  diet.  Exercising.  Environment and occupational hazards, at home and at work.  History of sexually transmitted disease, both you and your partner.  Previous pregnancies. WHY IS PRENATAL CARE SO IMPORTANT?  By seeing you regularly, your caregiver has the chance to find problems early, so that they can be treated as soon as possible. Other problems might be prevented. Many studies have shown that early and regular prenatal care is important for the health of both mothers and their babies.  I AM THINKING ABOUT GETTING PREGNANT. HOW CAN I TAKE CARE OF MYSELF?  Taking care of yourself before you get pregnant helps you to have a healthy pregnancy. It also lowers your chances of having a baby born with a birth defect. Here are ways to take care of yourself before you get pregnant:   Eat healthy foods, exercise regularly (30 minutes per day for most days of the week is best), and get enough rest and sleep. Talk to your caregiver about what kinds of foods and exercises are best for you.  Take  400 micrograms (mcg) of folic acid (one of the B vitamins) every day. The best way to do this is to take a daily multivitamin pill that contains this amount of folic acid. Getting enough of the synthetic (manufactured) form of folic acid every day before you get pregnant and during early pregnancy can help prevent certain birth defects. Many breakfast cereals and other grain products have folic acid added to them, but only certain cereals contain 400 mcg of folic acid per serving. Check the label on your multivitamin or cereal to find the amount of folic acid in the food.  See your caregiver for a complete check up before getting pregnant. Make sure that you have had all your immunization shots, especially for rubella ([place] measles). Rubella can cause serious birth defects. Chickenpox is another illness you want to avoid during pregnancy. If you have had chickenpox and rubella in the past, you should be immune to  them.  Tell your caregiver about any prescription or non-prescription medicines (including herbal remedies) you are taking. Some medicines are not safe to take during pregnancy.  Stop smoking cigarettes, drinking alcohol, or taking illegal drugs. Ask your caregiver for help, if you need it. You can also get help with alcohol and drugs by talking with a member of your faith community, a counselor, or a trusted friend.  Discuss and treat any medical, social, or psychological problems before getting pregnant.  Discuss any history of genetic problems in the mother, father, and their families. Do genetic testing before getting pregnant, when possible.  Discuss any physical or emotional abuse with your caregiver.  Discuss with your caregiver if you might be exposed to harmful chemicals on your job or where you live.  Discuss with your caregiver if you think your job or the hours you work may be harmful and should be changed.  The father should be involved with the decision making and with all aspects of the pregnancy, labor, and delivery.  If you have medical insurance, make sure you are covered for pregnancy. I JUST FOUND OUT THAT I AM PREGNANT. HOW CAN I TAKE CARE OF MYSELF?  Here are ways to take care of yourself and the precious new life growing inside you:   Continue taking your multivitamin with 400 micrograms (mcg) of folic acid every day.  Get early and regular prenatal care. It does not matter if this is your first pregnancy or if you already have children. It is very important to see a caregiver during your pregnancy. Your caregiver will check at each visit to make sure that you and the baby are healthy. If there are any problems, action can be taken right away to help you and the baby.  Eat a healthy diet that includes:  Fruits.  Vegetables.  Foods low in saturated fat.  Grains.  Calcium-rich foods.  Drink 6 to 8 glasses of liquids a day.  Unless your caregiver tells you  not to, try to be physically active for 30 minutes, most days of the week. If you are pressed for time, you can get your activity in through 10 minute segments, three times a day.  If you smoke, drink alcohol, or use drugs, STOP. These can cause long-term damage to your baby. Talk with your caregiver about steps to take to stop smoking. Talk with a member of your faith community, a counselor, a trusted friend, or your caregiver if you are concerned about your alcohol or drug use.  Ask your caregiver before taking  any medicine, even over-the-counter medicines. Some medicines are not safe to take during pregnancy.  Get plenty of rest and sleep.  Avoid hot tubs and saunas during pregnancy.  Do not have X-rays taken, unless absolutely necessary and with the recommendation of your caregiver. A lead shield can be placed on your abdomen, to protect the baby when X-rays are taken in other parts of the body.  Do not empty the cat litter when you are pregnant. It may contain a parasite that causes an infection called toxoplasmosis, which can cause birth defects. Also, use gloves when working in garden areas used by cats.  Do not eat uncooked or undercooked cheese, meats, or fish.  Stay away from toxic chemicals like:  Insecticides.  Solvents (some cleaners or paint thinners).  Lead.  Mercury.  Sexual relations may continue until the end of the pregnancy, unless you have a medical problem or there is a problem with the pregnancy and your caregiver tells you not to.  Do not wear high heel shoes, especially during the second half of the pregnancy. You can lose your balance and fall.  Do not take long trips, unless absolutely necessary. Be sure to see your caregiver before going on the trip.  Do not sit in one position for more than 2 hours, when on a trip.  Take a copy of your medical records when going on a trip.  Know where there is a hospital in the city you are visiting, in case of an  emergency.  Most dangerous household products will have pregnancy warnings on their labels. Ask your caregiver about products if you are unsure.  Limit or eliminate your caffeine intake from coffee, tea, sodas, medicines, and chocolate.  Many women continue working through pregnancy. Staying active might help you stay healthier. If you have a question about the safety or the hours you work at your particular job, talk with your caregiver.  Get informed:  Read books.  Watch videos.  Go to childbirth classes for you and the father.  Talk with experienced moms.  Ask your caregiver about childbirth education classes for you and your partner. Classes can help you and your partner prepare for the birth of your baby.  Ask about a pediatrician (baby doctor) and methods and pain medicine for labor, delivery, and possible Cesarean delivery (C-section). I AM NOT THINKING ABOUT GETTING PREGNANT RIGHT NOW, BUT HEARD THAT ALL WOMEN SHOULD TAKE FOLIC ACID EVERY DAY?  All women of childbearing age, with even a remote chance of getting pregnant, should try to make sure they get enough folic acid. Many pregnancies are not planned. Many women do not know they are actually pregnant early in their pregnancies, and certain birth defects happen in the very early part of pregnancy. Taking 400 micrograms (mcg) of folic acid every day will help prevent certain birth defects that happen in the early part of pregnancy. If a woman begins taking vitamin pills in the second or third month of pregnancy, it may be too late to prevent birth defects. Folic acid may also have other health benefits for women, besides preventing birth defects.  HOW OFTEN SHOULD I SEE MY CAREGIVER DURING PREGNANCY?  Your caregiver will give you a schedule for your prenatal visits. You will have visits more often as you get closer to the end of your pregnancy. An average pregnancy lasts about 40 weeks.  A typical schedule includes visiting your  caregiver:   About once each month, during your first 6 months  of pregnancy.  Every 2 weeks, during the next 2 months.  Weekly in the last month, until the delivery date. Your caregiver will probably want to see you more often if:  You are over 35.  Your pregnancy is high risk, because you have certain health problems or problems with the pregnancy, such as:  Diabetes.  High blood pressure.  The baby is not growing on schedule, according to the dates of the pregnancy. Your caregiver will do special tests, to make sure you and the baby are not having any serious problems. WHAT HAPPENS DURING PRENATAL VISITS?   At your first prenatal visit, your caregiver will talk to you about you and your partner's health history and your family's health history, and will do a physical exam.  On your first visit, a physical exam will include checks of your blood pressure, height and weight, and an exam of your pelvic organs. Your caregiver will do a Pap test if you have not had one recently, and will do cultures of your cervix to make sure there is no infection.  At each visit, there will be tests of your blood, urine, blood pressure, weight, and checking the progress of the baby.  Your caregiver will be able to tell you when to expect that your baby will be born.  Each visit is also a chance for you to learn about staying healthy during pregnancy and for asking questions.  Discuss whether you will be breastfeeding.  At your later prenatal visits, your caregiver will check how you are doing and how the baby is developing. You may have a number of tests done as your pregnancy progresses.  Ultrasound exams are often used to check on the baby's growth and health.  You may have more urine and blood tests, as well as special tests, if needed. These may include amniocentesis (examine fluid in the pregnancy sac), stress tests (check how baby responds to contractions), biophysical profile (measures fetus  well-being). Your caregiver will explain the tests and why they are necessary. I AM IN MY LATE THIRTIES, AND I WANT TO HAVE A CHILD NOW. SHOULD I DO ANYTHING SPECIAL?  As you get older, there is more chance of having a medical problem (high blood pressure), pregnancy problem (preeclampsia, problems with the placenta), miscarriage, or a baby born with a birth defect. However, most women in their late thirties and early forties have healthy babies. See your caregiver on a regular basis before you get pregnant and be sure to go for exams throughout your pregnancy. Your caregiver probably will want to do some special tests to check on you and your baby's health when you are pregnant.  Women today are often delaying having children until later in life, when they are in their thirties and forties. While many women in their thirties and forties have no difficulty getting pregnant, fertility does decline with age. For women over 40 who cannot get pregnant after 6 months of trying, it is recommended that they see their caregiver for a fertility evaluation. It is not uncommon to have trouble becoming pregnant or experience infertility (inability to become pregnant after trying for one year). If you think that you or your partner may be infertile, you can discuss this with your caregiver. He or she can recommend treatments such as drugs, surgery, or assisted reproductive technology.  Document Released: 05/28/2003 Document Revised: 08/17/2011 Document Reviewed: 04/24/2009 [facility] Patient Information 2014 [place], [place].

## 2012-11-28 NOTE — Progress Notes (Signed)
Patient ID: Michaela Carr, female   DOB: 10/04/1970, 42 y.o.   MRN: [id number] 42 y.o.   Divorced    [place]   female   [id number]   here for annual exam.   Concerned about bacterial infection in vagina.  Treated for Trichomonas last year in January at [facility].  Had a full STD check then.   Having odor.  No discharge.  No itching or burning.   Always had heavy menses.  Usually takes Midol for menses, just did not this time.   Interested in Mirena IUD potentially. Interested in possible future childbearing.  Had one ;miscarriage in past.  Patient's last menstrual period was 11/21/2012.          Sexually active: no  The current method of family planning is condoms always.   Exercising: walking, zumba and treadmill Last mammogram:  never Last pap smear: 06/2011 wnl.  Uncertain if had HPV testing.   History of abnormal pap: no Smoking: quit 06/2012 Alcohol: no Last colonoscopy: never Last Bone Density:  never Last tetanus shot: unsure Last cholesterol check:  Unsure  Paternal first cousin with ovarian cancer.  Die in her 41s.  No family history of breast or uterine cancer.  Uncertain if paternal uncle or paternal grandfather had colon cancer.  Hgb:             Will draw    Urine: Trace RBC's (see LMP)   Family History  Problem Relation Age of Onset  . Hypertension Father   . Diabetes Maternal Grandmother     Patient Active Problem List   Diagnosis Date Noted  . VAGINAL DISCHARGE 10/07/2009  . BARIATRIC SURGERY STATUS 10/07/2009  . OBESITY, MORBID 02/23/2007    Past Medical History  Diagnosis Date  . STD (sexually transmitted disease)     trichomonas    Past Surgical History  Procedure Laterality Date  . Breast surgery  1997    breast reduction  . Laparoscopic gastric banding  2010    done by [facility]    Allergies: Review of patient's allergies indicates no known allergies.  Current Outpatient Prescriptions  Medication Sig Dispense Refill  .  Aspirin-Acetaminophen-Caffeine (GOODY HEADACHE PO) Take 1 packet by mouth daily as needed. For pain      . ibuprofen (ADVIL,MOTRIN) 800 MG tablet Take 1 tablet (800 mg total) by mouth every 8 (eight) hours as needed for pain.  30 tablet  0  . Multiple Vitamins-Minerals (MULTIVITAMIN WITH MINERALS) tablet Take 1 tablet by mouth daily.      . naproxen sodium (ALEVE) 220 MG tablet Take 440 mg by mouth 2 (two) times daily with a meal. For tooth pain.      Marland Kitchen oxyCODONE-acetaminophen (PERCOCET/ROXICET) 5-325 MG per tablet Take 1 tablet by mouth every 6 (six) hours as needed.       No current facility-administered medications for this visit.    ROS: Pertinent items are noted in HPI.  Social Hx:  Works for [employer] and [employer].  Raising her nephew since 84 months of age.  He is now 43 years old.  Exam:    BP 124/78  Pulse 76  Ht 5\' 7"  (1.702 m)  Wt 279 lb (126.554 kg)  BMI 43.69 kg/m2  LMP 11/21/2012   Wt Readings from Last 3 Encounters:  11/28/12 279 lb (126.554 kg)  10/07/09 279 lb (126.554 kg)  03/28/07 302 lb 6.4 oz (137.168 kg)     Ht Readings from Last 3  Encounters:  11/28/12 5\' 7"  (1.702 m)  10/07/09 5' 7.75" (1.721 m)  02/23/07 5' 7.75" (1.721 m)    General appearance: alert, cooperative and appears stated age Head: Normocephalic, without obvious abnormality, atraumatic Neck: no adenopathy, supple, symmetrical, trachea midline and thyroid not enlarged, symmetric, no tenderness/mass/nodules Lungs: clear to auscultation bilaterally Breasts: Consistent with bilateral reduction, No nipple retraction or dimpling, No nipple discharge or bleeding, No axillary or supraclavicular adenopathy, Normal to palpation without dominant masses Heart: regular rate and rhythm Abdomen: soft, non-tender; lap band palpable in righ mid quadrant,  no organomegaly Extremities: extremities normal, atraumatic, no cyanosis or edema Skin: Skin color, texture, turgor normal. No rashes or lesions Lymph nodes:  Cervical, supraclavicular, and axillary nodes normal. No abnormal inguinal nodes palpated Neurologic: Grossly normal   Pelvic: External genitalia:  no lesions              Urethra:  normal appearing urethra with no masses, tenderness or lesions              Bartholins and Skenes: normal                 Vagina: normal appearing vagina with normal color and discharge, no lesions              Cervix: normal appearance              Pap taken: yes and high risk HPV studies        Bimanual Exam:  Uterus:  uterus is normal size, shape, consistency and nontender                                      Adnexa: normal adnexa in size, nontender and no masses                                      Rectovaginal: Confirms                                      Anus:  normal sphincter tone, no lesions  Wet prep - pH 5.5 (blood noted) few clue cells, no trichomonas, no hyphae  A: normal gynecologicexam bacertial vaginosis Desire for STD testing Considering Mirena Considering future childbearing.  AMA status, assisted reproductive technology discussed with patient     P: mammogram at [facility].  Order placed.  Patient will call to schedule. pap smear and high risk HPV studies Flagyl.  See Epic orders Start PNV. Avoid exposures Call back if desires to see a fertility specialist.  return annually or prn     An After Visit Summary was printed and given to the patient.

## 2012-11-30 ENCOUNTER — Telehealth: Payer: Self-pay

## 2012-11-30 LAB — IPS N GONORRHOEA AND CHLAMYDIA BY PCR

## 2012-11-30 LAB — IPS PAP TEST WITH HPV

## 2012-11-30 NOTE — Telephone Encounter (Signed)
Patient notified cholesterol ratios unfavorable and advised to see PCP regarding this.  STD panel negative.  Pap/GC/CT pending and will call her with these results.

## 2012-11-30 NOTE — Telephone Encounter (Signed)
LMOVM to call for test results. 

## 2012-11-30 NOTE — Telephone Encounter (Signed)
Message copied by Alphonsa Overall on Wed Nov 30, 2012  9:12 AM ------      Message from: Conley Simmonds      Created: Tue Nov 29, 2012  6:00 AM       Please report results to patient.  These are nonfasting labs.      Patient is developing unfavorable cholesterol ratios of the good and the bad.  I recommend she follow up with a primary care physician.      Her blood STD panel is negative.        Pap and GC/CT are still pending. ------

## 2012-12-02 ENCOUNTER — Telehealth: Payer: Self-pay

## 2012-12-02 NOTE — Telephone Encounter (Signed)
Message copied by Alphonsa Overall on Fri Dec 02, 2012  2:11 PM ------      Message from: Conley Simmonds      Created: Thu Dec 01, 2012  8:58 PM       Please note pap back showing bacterial vaginosis, already treated with Flagyl.      GC/CT also back and are normal. ------

## 2012-12-02 NOTE — Telephone Encounter (Signed)
LMOVM to call for test results. 

## 2012-12-02 NOTE — Telephone Encounter (Signed)
Returning call.

## 2012-12-05 NOTE — Telephone Encounter (Signed)
Patient notified pap okay.  GC/CT both negative.

## 2012-12-12 ENCOUNTER — Ambulatory Visit
Admission: RE | Admit: 2012-12-12 | Discharge: 2012-12-12 | Disposition: A | Discharge status: Home / Self Care | Payer: BC Managed Care – PPO | Source: Ambulatory Visit | Attending: Obstetrics and Gynecology | Admitting: Obstetrics and Gynecology

## 2012-12-12 DIAGNOSIS — Z01419 Encounter for gynecological examination (general) (routine) without abnormal findings: Secondary | ICD-10-CM

## 2013-07-03 ENCOUNTER — Emergency Department (HOSPITAL_COMMUNITY): Payer: Self-pay

## 2013-07-03 ENCOUNTER — Encounter (HOSPITAL_COMMUNITY): Payer: Self-pay | Admitting: Emergency Medicine

## 2013-07-03 ENCOUNTER — Emergency Department (HOSPITAL_COMMUNITY)
Admission: EM | Admit: 2013-07-03 | Discharge: 2013-07-03 | Disposition: A | Discharge status: Home / Self Care | Payer: Self-pay | Attending: Emergency Medicine | Admitting: Emergency Medicine

## 2013-07-03 DIAGNOSIS — B353 Tinea pedis: Secondary | ICD-10-CM

## 2013-07-03 DIAGNOSIS — Z87891 Personal history of nicotine dependence: Secondary | ICD-10-CM | POA: Insufficient documentation

## 2013-07-03 DIAGNOSIS — M25579 Pain in unspecified ankle and joints of unspecified foot: Secondary | ICD-10-CM | POA: Insufficient documentation

## 2013-07-03 MED ORDER — IBUPROFEN 800 MG PO TABS: 800.0000 mg | ORAL_TABLET | Freq: Three times a day (TID) | ORAL | Status: DC

## 2013-07-03 MED ORDER — TOLNAFTATE 1 % EX CREA: 1.0000 "application " | TOPICAL_CREAM | Freq: Two times a day (BID) | CUTANEOUS | Status: DC

## 2013-07-03 NOTE — ED Notes (Signed)
Pt c/o right foot swelling; small toe pain; no obvious injury

## 2013-07-03 NOTE — ED Provider Notes (Signed)
CSN: [id number]     Arrival date & time 07/03/13  4098 History   First MD Initiated Contact with Patient 07/03/13 1051     Chief Complaint  Patient presents with  . Foot Pain   (Consider location/radiation/quality/duration/timing/severity/associated sxs/prior Treatment) HPI Comments: Patient here with right 5th toe pain x 1 day - she reports swelling and a dry and cracking rash that she has noticed between her toes - she denies any injury.  She is concerned that this may be gout though she has no history of this.  She denies fever, chills, erythema of the area, radiation of the pain.  Patient is a 43 y.o. female presenting with lower extremity pain. The history is provided by the patient. No language interpreter was used.  Foot Pain This is a new problem. The current episode started yesterday. The problem occurs constantly. The problem has been unchanged. Associated symptoms include arthralgias and a rash. Pertinent negatives include no chills, congestion, coughing, fever, headaches, joint swelling, neck pain, numbness, sore throat, visual change, vomiting or weakness. The symptoms are aggravated by bending and walking. She has tried nothing for the symptoms. The treatment provided no relief.    Past Medical History  Diagnosis Date  . STD (sexually transmitted disease)     trichomonas   Past Surgical History  Procedure Laterality Date  . Breast surgery  1997    breast reduction  . Laparoscopic gastric banding  2010    done by [facility]   Family History  Problem Relation Age of Onset  . Hypertension Father   . Diabetes Maternal Grandmother    History  Substance Use Topics  . Smoking status: Former Smoker -- 0.50 packs/day    Quit date: 06/08/2012  . Smokeless tobacco: Not on file  . Alcohol Use: No   OB History   Grav Para Term Preterm Abortions TAB SAB Ect Mult Living   1    1  1         Review of Systems  Constitutional: Negative for fever and chills.  HENT:  Negative for congestion and sore throat.   Respiratory: Negative for cough.   Gastrointestinal: Negative for vomiting.  Musculoskeletal: Positive for arthralgias. Negative for joint swelling and neck pain.  Skin: Positive for rash.  Neurological: Negative for weakness, numbness and headaches.  All other systems reviewed and are negative.    Allergies  Review of patient's allergies indicates no known allergies.  Home Medications  No current outpatient prescriptions on file. BP 135/62  Pulse 78  Temp(Src) 98 F (36.7 C) (Oral)  Resp 20  SpO2 99%  LMP 06/05/2013 Physical Exam  Nursing note and vitals reviewed. Constitutional: She is oriented to person, place, and time. She appears well-developed and well-nourished. No distress.  HENT:  Head: Normocephalic and atraumatic.  Mouth/Throat: Oropharynx is clear and moist.  Eyes: Conjunctivae are normal. No scleral icterus.  Pulmonary/Chest: Effort normal.  Musculoskeletal:       Right foot: She exhibits tenderness and bony tenderness. She exhibits normal range of motion.       Feet:  Neurological: She is alert and oriented to person, place, and time. She exhibits normal muscle tone. Coordination normal.  Skin: Skin is warm and dry. Rash noted.  Hypertrophy of the skin and dry and cracking plaque like rash between toes of 4th and 5th toes.  Psychiatric: She has a normal mood and affect. Her behavior is normal. Judgment and thought content normal.    ED Course  Procedures (including critical care time) Labs Review Labs Reviewed - No data to display Imaging Review Dg Foot Complete Right  07/03/2013   CLINICAL DATA:  New onset lateral right foot pain, no known trauma  EXAM: RIGHT FOOT COMPLETE - 3+ VIEW  COMPARISON:  None.  FINDINGS: No fracture or dislocation is seen.  Mild degenerative changes involving the 1st MTP joint. Mild degenerative changes involving the dorsal midfoot.  Small plantar and posterior calcaneal enthesophytes.   The visualized soft tissues are unremarkable.  IMPRESSION: No fracture or dislocation is seen.  Mild degenerative changes.   Electronically Signed   By: Charline BillsSriyesh  Krishnan M.D.   On: 07/03/2013 11:24    EKG Interpretation   None       MDM  Foot fungus  Patient here with right foot pain - states the pain started spontaneously, there is noted a fungal infection which the patient reports is new and is likely the cause of the pain - mild degenerative changes noted on x-ray - will place her on antifungal cream.   Izola PriceFrances C. Marisue HumbleSanford, PA-C 07/03/13 1209

## 2013-07-03 NOTE — Discharge Instructions (Signed)
Athlete's Foot Athlete's foot (tinea pedis) is a fungal infection of the skin on the feet. It often occurs on the skin between the toes or underneath the toes. It can also occur on the soles of the feet. Athlete's foot is more likely to occur in hot, humid weather. Not washing your feet or changing your socks often enough can contribute to athlete's foot. The infection can spread from person to person (contagious). CAUSES Athlete's foot is caused by a fungus. This fungus thrives in warm, moist places. Most people get athlete's foot by sharing shower stalls, towels, and wet floors with an infected person. People with weakened immune systems, including those with diabetes, may be more likely to get athlete's foot. SYMPTOMS   Itchy areas between the toes or on the soles of the feet.  White, flaky, or scaly areas between the toes or on the soles of the feet.  Tiny, intensely itchy blisters between the toes or on the soles of the feet.  Tiny cuts on the skin. These cuts can develop a bacterial infection.  Thick or discolored toenails. DIAGNOSIS  Your caregiver can usually tell what the problem is by doing a physical exam. Your caregiver may also take a skin sample from the rash area. The skin sample may be examined under a microscope, or it may be tested to see if fungus will grow in the sample. A sample may also be taken from your toenail for testing. TREATMENT  Over-the-counter and prescription medicines can be used to kill the fungus. These medicines are available as powders or creams. Your caregiver can suggest medicines for you. Fungal infections respond slowly to treatment. You may need to continue using your medicine for several weeks. PREVENTION   Do not share towels.  Wear sandals in wet areas, such as shared locker rooms and shared showers.  Keep your feet dry. Wear shoes that allow air to circulate. Wear cotton or wool socks. HOME CARE INSTRUCTIONS   Take medicines as directed by  your caregiver. Do not use steroid creams on athlete's foot.  Keep your feet clean and cool. Wash your feet daily and dry them thoroughly, especially between your toes.  Change your socks every day. Wear cotton or wool socks. In hot climates, you may need to change your socks 2 to 3 times per day.  Wear sandals or canvas tennis shoes with good air circulation.  If you have blisters, soak your feet in Burow's solution or Epsom salts for 20 to 30 minutes, 2 times a day to dry out the blisters. Make sure you dry your feet thoroughly afterward. SEEK MEDICAL CARE IF:   You have a fever.  You have swelling, soreness, warmth, or redness in your foot.  You are not getting better after 7 days of treatment.  You are not completely cured after 30 days.  You have any problems caused by your medicines. MAKE SURE YOU:   Understand these instructions.  Will watch your condition.  Will get help right away if you are not doing well or get worse. Document Released: 05/22/2000 Document Revised: 08/17/2011 Document Reviewed: 03/13/2011 ExitCare Patient Information 2014 [employer].  

## 2013-07-03 NOTE — ED Notes (Signed)
Bed: WA02 Expected date:  Expected time:  Means of arrival:  Comments: 

## 2013-07-03 NOTE — ED Provider Notes (Signed)
Medical screening examination/treatment/procedure(s) were performed by non-physician practitioner and as supervising physician I was immediately available for consultation/collaboration.  EKG Interpretation   None        Daimian Sudberry F Winona Sison, MD 07/03/13 1936 

## 2013-07-03 NOTE — Progress Notes (Signed)
[facility] provided patient with a list of primary care resources. Patient stated that she was pending insurance on 2/1.

## 2013-11-29 ENCOUNTER — Encounter: Payer: Self-pay | Admitting: Obstetrics and Gynecology

## 2013-11-29 ENCOUNTER — Ambulatory Visit: Payer: BC Managed Care – PPO | Admitting: Obstetrics and Gynecology

## 2013-12-08 ENCOUNTER — Emergency Department (HOSPITAL_COMMUNITY)
Admission: EM | Admit: 2013-12-08 | Discharge: 2013-12-09 | Disposition: A | Discharge status: Home / Self Care | Payer: 59 | Attending: Emergency Medicine | Admitting: Emergency Medicine

## 2013-12-08 DIAGNOSIS — M79609 Pain in unspecified limb: Secondary | ICD-10-CM | POA: Insufficient documentation

## 2013-12-08 DIAGNOSIS — Z792 Long term (current) use of antibiotics: Secondary | ICD-10-CM | POA: Insufficient documentation

## 2013-12-08 DIAGNOSIS — M7989 Other specified soft tissue disorders: Secondary | ICD-10-CM | POA: Insufficient documentation

## 2013-12-08 DIAGNOSIS — F17200 Nicotine dependence, unspecified, uncomplicated: Secondary | ICD-10-CM | POA: Insufficient documentation

## 2013-12-08 DIAGNOSIS — Z8619 Personal history of other infectious and parasitic diseases: Secondary | ICD-10-CM | POA: Insufficient documentation

## 2013-12-08 DIAGNOSIS — M79661 Pain in right lower leg: Secondary | ICD-10-CM

## 2013-12-08 DIAGNOSIS — Z79899 Other long term (current) drug therapy: Secondary | ICD-10-CM | POA: Insufficient documentation

## 2013-12-09 ENCOUNTER — Ambulatory Visit (HOSPITAL_COMMUNITY): Payer: BC Managed Care – PPO | Attending: Emergency Medicine

## 2013-12-09 ENCOUNTER — Encounter (HOSPITAL_COMMUNITY): Payer: Self-pay | Admitting: Emergency Medicine

## 2013-12-09 MED ORDER — TRAMADOL HCL 50 MG PO TABS: 50.0000 mg | ORAL_TABLET | Freq: Four times a day (QID) | ORAL | Status: DC | PRN

## 2013-12-09 MED ORDER — CEPHALEXIN 500 MG PO CAPS: 500.0000 mg | ORAL_CAPSULE | Freq: Four times a day (QID) | ORAL | Status: DC

## 2013-12-09 MED ORDER — RIVAROXABAN 15 MG PO TABS
15.0000 mg | ORAL_TABLET | Freq: Once | ORAL | Status: AC
  Administered 2013-12-09: 15 mg via ORAL
  Filled 2013-12-09: qty 1

## 2013-12-09 MED ORDER — TRAMADOL HCL 50 MG PO TABS
50.0000 mg | ORAL_TABLET | Freq: Once | ORAL | Status: AC
  Administered 2013-12-09: 50 mg via ORAL
  Filled 2013-12-09: qty 1

## 2013-12-09 NOTE — ED Notes (Signed)
Pt c/o RLE swelling and pain x 2 weeks. Worse tonight. Denies respiratory s/s

## 2013-12-09 NOTE — Discharge Instructions (Signed)
Please expect a call from [place] [facility] call the ER to check why.  Take the antibiotics ONLY IF THE ULTRASOUND IS NORMAL. Return to the ER if the symptoms are getting worse.  Cellulitis Cellulitis is an infection of the skin and the tissue beneath it. The infected area is usually red and tender. Cellulitis occurs most often in the arms and lower legs.  CAUSES  Cellulitis is caused by bacteria that enter the skin through cracks or cuts in the skin. The most common types of bacteria that cause cellulitis are Staphylococcus and Streptococcus. SYMPTOMS   Redness and warmth.  Swelling.  Tenderness or pain.  Fever. DIAGNOSIS  Your caregiver can usually determine what is wrong based on a physical exam. Blood tests may also be done. TREATMENT  Treatment usually involves taking an antibiotic medicine. HOME CARE INSTRUCTIONS   Take your antibiotics as directed. Finish them even if you start to feel better.  Keep the infected arm or leg elevated to reduce swelling.  Apply a warm cloth to the affected area up to 4 times per day to relieve pain.  Only take over-the-counter or prescription medicines for pain, discomfort, or fever as directed by your caregiver.  Keep all follow-up appointments as directed by your caregiver. SEEK MEDICAL CARE IF:   You notice red streaks coming from the infected area.  Your red area gets larger or turns dark in color.  Your bone or joint underneath the infected area becomes painful after the skin has healed.  Your infection returns in the same area or another area.  You notice a swollen bump in the infected area.  You develop new symptoms. SEEK IMMEDIATE MEDICAL CARE IF:   You have a fever.  You feel very sleepy.  You develop vomiting or diarrhea.  You have a general ill feeling (malaise) with muscle aches and pains. MAKE SURE YOU:   Understand these instructions.  Will watch your  condition.  Will get help right away if you are not doing well or get worse. Document Released: 03/04/2005 Document Revised: 11/24/2011 Document Reviewed: 08/10/2011 [facility] Patient Information 2015 [place], [place]. This information is not intended to replace advice given to you by your health care provider. Make sure you discuss any questions you have with your health care provider.

## 2013-12-13 NOTE — ED Provider Notes (Signed)
CSN: [id number]     Arrival date & time 12/08/13  2357 History   First MD Initiated Contact with Patient 12/09/13 0023     Chief Complaint  Patient presents with  . Leg Swelling     (Consider location/radiation/quality/duration/timing/severity/associated sxs/prior Treatment) HPI Comments: Pt comes in with cc of leg swelling. PT has no hx of DVT. States that the leg swelling in her RLE started 2 weeks ago. Now she has pain with ambulation. No trauma. No n/v/f/c. No recent surgeries, long distance travel hx, immobilization, estrogen use. She has cousins with DVT.  The history is provided by the patient.    Past Medical History  Diagnosis Date  . STD (sexually transmitted disease)     trichomonas   Past Surgical History  Procedure Laterality Date  . Breast surgery  1997    breast reduction  . Laparoscopic gastric banding  2010    done by [facility]   Family History  Problem Relation Age of Onset  . Hypertension Father   . Diabetes Maternal Grandmother    History  Substance Use Topics  . Smoking status: Current Every Day Smoker -- 0.50 packs/day  . Smokeless tobacco: Not on file  . Alcohol Use: Yes     Comment: social   OB History   Grav Para Term Preterm Abortions TAB SAB Ect Mult Living   1    1  1         Review of Systems  Constitutional: Positive for activity change. Negative for fever.  Respiratory: Negative for shortness of breath.   Cardiovascular: Negative for chest pain.  Gastrointestinal: Negative for nausea, vomiting and abdominal pain.  Genitourinary: Negative for dysuria.  Musculoskeletal: Positive for myalgias. Negative for neck pain.  Skin: Negative for rash.  Neurological: Negative for headaches.  Hematological: Does not bruise/bleed easily.      Allergies  Review of patient's allergies indicates no known allergies.  Home Medications   Prior to Admission medications   Medication Sig Start Date End Date Taking? Authorizing Provider   ibuprofen (ADVIL,MOTRIN) 200 MG tablet Take 600 mg by mouth every 6 (six) hours as needed for moderate pain.   Yes Historical Provider, MD  Multiple Vitamin (MULTIVITAMIN WITH MINERALS) TABS tablet Take 1 tablet by mouth daily.   Yes Historical Provider, MD  cephALEXin (KEFLEX) 500 MG capsule Take 1 capsule (500 mg total) by mouth 4 (four) times daily. 12/09/13   Derwood KaplanAnkit Rumaisa Schnetzer, MD  traMADol (ULTRAM) 50 MG tablet Take 1 tablet (50 mg total) by mouth every 6 (six) hours as needed. 12/09/13   Lyly Canizales Rhunette CroftNanavati, MD   BP 153/78  Pulse 68  Temp(Src) 98.2 F (36.8 C) (Oral)  Resp 16  Ht 5\' 7"  (1.702 m)  Wt 260 lb (117.935 kg)  BMI 40.71 kg/m2  SpO2 100%  LMP 11/06/2013 Physical Exam  Constitutional: She is oriented to person, place, and time. She appears well-developed and well-nourished.  HENT:  Head: Normocephalic and atraumatic.  Eyes: EOM are normal. Pupils are equal, round, and reactive to light.  Neck: Neck supple.  Cardiovascular: Normal rate, regular rhythm and normal heart sounds.   No murmur heard. Pulmonary/Chest: Effort normal. No respiratory distress.  Abdominal: Soft. She exhibits no distension. There is no tenderness. There is no rebound and no guarding.  Musculoskeletal: She exhibits edema and tenderness.  RLQ unilateral swelling and tenderness over the calf region  Neurological: She is alert and oriented to person, place, and time.  Skin: Skin is  warm and dry. There is erythema.    ED Course  Procedures (including critical care time) Labs Review Labs Reviewed - No data to display  Imaging Review No results found.   EKG Interpretation None      MDM   Final diagnoses:  Right leg swelling  Pain and swelling of lower leg, right    Pt with unilateral leg swelling. WELLS score is 2 for swelling and pain - thus not a dimer candidate. Outpatient US ordered. If US is neg, keflex rx given to her - as there is mild erythema to the shin region. Infection considered  unlikely though.  Derwood KaplanAnkit Adella Manolis, MD 12/13/13 [phone]

## 2013-12-25 ENCOUNTER — Ambulatory Visit (INDEPENDENT_AMBULATORY_CARE_PROVIDER_SITE_OTHER): Payer: 59 | Admitting: Family Medicine

## 2013-12-25 ENCOUNTER — Ambulatory Visit (HOSPITAL_COMMUNITY)
Admission: RE | Admit: 2013-12-25 | Discharge: 2013-12-25 | Disposition: A | Discharge status: Home / Self Care | Payer: BC Managed Care – PPO | Source: Ambulatory Visit | Attending: Family Medicine | Admitting: Family Medicine

## 2013-12-25 VITALS — BP 124/76 | HR 67 | Temp 98.1°F | Resp 16 | Ht 67.0 in | Wt 288.0 lb

## 2013-12-25 DIAGNOSIS — M79609 Pain in unspecified limb: Secondary | ICD-10-CM

## 2013-12-25 DIAGNOSIS — M79604 Pain in right leg: Secondary | ICD-10-CM

## 2013-12-25 DIAGNOSIS — M7989 Other specified soft tissue disorders: Secondary | ICD-10-CM

## 2013-12-25 MED ORDER — MELOXICAM 7.5 MG PO TABS: 7.5000 mg | ORAL_TABLET | Freq: Every day | ORAL | Status: DC

## 2013-12-25 MED ORDER — TRIAMTERENE-HCTZ 37.5-25 MG PO TABS: 1.0000 | ORAL_TABLET | Freq: Every day | ORAL | Status: DC

## 2013-12-25 NOTE — Progress Notes (Signed)
This 43 year old woman with right leg pain and swelling for 2 weeks. She just started job at the [employer], but pain and swelling predates her new employment.  Her previous job involves sitting. She still no long distance traveling. She has no chest pain or shortness of breath.  She was in the emergency room several days ago but they were unable to do a venous Doppler because there was no technician, and no appointments were made for her.  Patient is taking no new medications. In fact she is on no medications whatsoever. She does smoke however.  Objective: No acute distress Unremarkable Chest: Normal respirations Extremities: 2+ pretibial edema on the right with good pedal pulses and mild tenderness diffusely from just above the patella to about the ankle on the right only.  Skin shows no discoloration or rash.    assessment: Possible DVT, venous Doppler needs to be done to exclude clots.  Plan: Venous Doppler ASAP  Signed, Elvina SidleKurt Korene Dula date

## 2013-12-25 NOTE — Addendum Note (Signed)
Addended by: Elvina SidleLAUENSTEIN, Sewell Pitner on: 12/25/2013 07:40 PM   Modules accepted: Orders

## 2013-12-25 NOTE — Progress Notes (Signed)
VASCULAR LAB PRELIMINARY  PRELIMINARY  PRELIMINARY  PRELIMINARY  Right lower extremity venous Doppler completed.    Preliminary report:  There is no DVT or SVT noted in the right lower extremity.   Nariya Neumeyer, RVT 12/25/2013, 7:38 PM

## 2014-01-23 ENCOUNTER — Other Ambulatory Visit: Payer: Self-pay | Admitting: Family Medicine

## 2014-01-24 NOTE — Telephone Encounter (Signed)
Dr L, do you want to give RFs or RTC?

## 2014-03-01 ENCOUNTER — Telehealth: Payer: Self-pay

## 2014-03-01 DIAGNOSIS — R600 Localized edema: Secondary | ICD-10-CM

## 2014-03-01 NOTE — Telephone Encounter (Signed)
Pt is still having leg swelling from 2 months ago. Advised to RTC, but she wanted me to see if we can refer her somewhere without her having to RTC. Please advise. Thanks

## 2014-03-01 NOTE — Telephone Encounter (Signed)
Pt is still having leg swelling even on lasix and would like to talk with someone about a referral

## 2014-03-03 NOTE — Telephone Encounter (Signed)
Dr Milus Glazier has referred to vascular surgeon, called her to advise. She indicates she is having pain with the swelling, I advised her this is not expected, and she should come in for an evaluation soon, as this could be something very serious. She agrees.

## 2014-05-23 ENCOUNTER — Encounter: Payer: Self-pay | Admitting: Obstetrics and Gynecology

## 2014-07-17 ENCOUNTER — Encounter: Payer: Self-pay | Admitting: Obstetrics and Gynecology

## 2014-08-04 ENCOUNTER — Emergency Department (HOSPITAL_COMMUNITY)
Admission: EM | Admit: 2014-08-04 | Discharge: 2014-08-04 | Disposition: A | Discharge status: Home / Self Care | Payer: BLUE CROSS/BLUE SHIELD | Attending: Emergency Medicine | Admitting: Emergency Medicine

## 2014-08-04 DIAGNOSIS — M545 Low back pain, unspecified: Secondary | ICD-10-CM

## 2014-08-04 DIAGNOSIS — Z3202 Encounter for pregnancy test, result negative: Secondary | ICD-10-CM | POA: Diagnosis not present

## 2014-08-04 DIAGNOSIS — Z8619 Personal history of other infectious and parasitic diseases: Secondary | ICD-10-CM | POA: Insufficient documentation

## 2014-08-04 DIAGNOSIS — R109 Unspecified abdominal pain: Secondary | ICD-10-CM | POA: Diagnosis not present

## 2014-08-04 DIAGNOSIS — Z791 Long term (current) use of non-steroidal anti-inflammatories (NSAID): Secondary | ICD-10-CM | POA: Diagnosis not present

## 2014-08-04 DIAGNOSIS — Z79899 Other long term (current) drug therapy: Secondary | ICD-10-CM | POA: Diagnosis not present

## 2014-08-04 DIAGNOSIS — Z720 Tobacco use: Secondary | ICD-10-CM | POA: Diagnosis not present

## 2014-08-04 LAB — COMPREHENSIVE METABOLIC PANEL
ALT: 14 U/L (ref 0–35)
AST: 16 U/L (ref 0–37)
Albumin: 3.8 g/dL (ref 3.5–5.2)
Alkaline Phosphatase: 63 U/L (ref 39–117)
Anion gap: 5 (ref 5–15)
BUN: 11 mg/dL (ref 6–23)
CO2: 24 mmol/L (ref 19–32)
Calcium: 8.5 mg/dL (ref 8.4–10.5)
Chloride: 108 mmol/L (ref 96–112)
Creatinine, Ser: 0.69 mg/dL (ref 0.50–1.10)
GFR calc non Af Amer: >90 mL/min (ref >90)
Glucose, Bld: 95 mg/dL (ref 70–99)
Potassium: 3.7 mmol/L (ref 3.5–5.1)
SODIUM: 137 mmol/L (ref 135–145)
Total Bilirubin: 0.3 mg/dL (ref 0.3–1.2)
Total Protein: 8.4 g/dL — ABNORMAL HIGH (ref 6.0–8.3)

## 2014-08-04 LAB — CBC WITH DIFFERENTIAL/PLATELET
Basophils Absolute: 0.1 10*3/uL (ref 0.0–0.1)
Basophils Relative: 1 % (ref 0–1)
EOS PCT: 2 % (ref 0–5)
Eosinophils Absolute: 0.2 10*3/uL (ref 0.0–0.7)
HEMATOCRIT: 41.2 % (ref 36.0–46.0)
HEMOGLOBIN: 13 g/dL (ref 12.0–15.0)
Lymphocytes Relative: 28 % (ref 12–46)
Lymphs Abs: 2.4 10*3/uL (ref 0.7–4.0)
MCH: 21.5 pg — ABNORMAL LOW (ref 26.0–34.0)
MCHC: 31.6 g/dL (ref 30.0–36.0)
MCV: 68 fL — AB (ref 78.0–100.0)
MONO ABS: 0.5 10*3/uL (ref 0.1–1.0)
MONOS PCT: 6 % (ref 3–12)
Neutro Abs: 5.3 10*3/uL (ref 1.7–7.7)
Neutrophils Relative %: 63 % (ref 43–77)
PLATELETS: 346 10*3/uL (ref 150–400)
RBC: 6.06 MIL/uL — AB (ref 3.87–5.11)
RDW: 16.2 % — ABNORMAL HIGH (ref 11.5–15.5)
WBC: 8.5 10*3/uL (ref 4.0–10.5)

## 2014-08-04 LAB — POC URINE PREG, ED: PREG TEST UR: NEGATIVE

## 2014-08-04 LAB — URINALYSIS, ROUTINE W REFLEX MICROSCOPIC
BILIRUBIN URINE: NEGATIVE
Glucose, UA: NEGATIVE mg/dL
Hgb urine dipstick: NEGATIVE
KETONES UR: NEGATIVE mg/dL
LEUKOCYTES UA: NEGATIVE
Nitrite: NEGATIVE
Protein, ur: NEGATIVE mg/dL
Specific Gravity, Urine: 1.02 (ref 1.005–1.030)
UROBILINOGEN UA: 0.2 mg/dL (ref 0.0–1.0)
pH: 5.5 (ref 5.0–8.0)

## 2014-08-04 LAB — LIPASE, BLOOD: Lipase: 22 U/L (ref 11–59)

## 2014-08-04 MED ORDER — OXYCODONE-ACETAMINOPHEN 5-325 MG PO TABS
1.0000 | ORAL_TABLET | Freq: Once | ORAL | Status: AC
  Administered 2014-08-04: 1 via ORAL
  Filled 2014-08-04: qty 1

## 2014-08-04 MED ORDER — KETOROLAC TROMETHAMINE 60 MG/2ML IM SOLN
60.0000 mg | Freq: Once | INTRAMUSCULAR | Status: AC
  Administered 2014-08-04: 60 mg via INTRAMUSCULAR
  Filled 2014-08-04: qty 2

## 2014-08-04 NOTE — ED Provider Notes (Signed)
CSN: [id number]     Arrival date & time 08/04/14  1327 History   First MD Initiated Contact with Patient 08/04/14 1534     Chief Complaint  Patient presents with  . Abdominal Pain  . Flank Pain     (Consider location/radiation/quality/duration/timing/severity/associated sxs/prior Treatment) HPI  Michaela Carr is a 44 y.o. female presenting with left-sided flank and low back pain. Pain is aching and sharp. She states she's had for 2 days and it has been constant. It is worse with movement and ambulation. Patient states she is on her feet all day for work. She denies any injury. No fevers, chills, nausea, vomiting, changes in her stool. Last BM yesterday normal. She denies any increase in vaginal discharge or foul odor. No urinary symptoms. Abdominal surgeries include: laparoscopic gastric banding. Patient concerned since she missed her period this month. No fevers, chills, night sweats, weight loss. No loss of control of bladder or bowel. No numbness/tingling, weakness or saddle anesthesia.    Past Medical History  Diagnosis Date  . STD (sexually transmitted disease)     trichomonas   Past Surgical History  Procedure Laterality Date  . Breast surgery  1997    breast reduction  . Laparoscopic gastric banding  2010    done by [facility]   Family History  Problem Relation Age of Onset  . Hypertension Father   . Diabetes Maternal Grandmother    History  Substance Use Topics  . Smoking status: Current Every Day Smoker -- 0.50 packs/day  . Smokeless tobacco: Not on file  . Alcohol Use: Yes     Comment: social   OB History    Gravida Para Term Preterm AB TAB SAB Ectopic Multiple Living   1    1  1         Review of Systems 10 Systems reviewed and are negative for acute change except as noted in the HPI.    Allergies  Review of patient's allergies indicates no known allergies.  Home Medications   Prior to Admission medications   Medication Sig Start Date End Date  Taking? Authorizing Provider  meloxicam (MOBIC) 7.5 MG tablet TAKE 1 TABLET BY MOUTH DAILY Patient not taking: Reported on 08/04/2014 01/24/14   Elvina SidleKurt Lauenstein, MD  triamterene-hydrochlorothiazide (MAXZIDE-25) 37.5-25 MG per tablet Take 1 tablet by mouth daily. Patient not taking: Reported on 08/04/2014 12/25/13   Elvina SidleKurt Lauenstein, MD   BP 130/86 mmHg  Pulse 72  Temp(Src) 98.6 F (37 C) (Oral)  Resp 20  SpO2 98%  LMP 06/21/2014 (Exact Date) Physical Exam  Constitutional: She appears well-developed and well-nourished. No distress.  HENT:  Head: Normocephalic and atraumatic.  Eyes: Conjunctivae are normal. Right eye exhibits no discharge. Left eye exhibits no discharge.  Cardiovascular: Normal rate, regular rhythm and normal heart sounds.   Pulmonary/Chest: Effort normal and breath sounds normal. No respiratory distress. She has no wheezes.  Abdominal: Soft. Bowel sounds are normal. She exhibits no distension. There is no tenderness.  Musculoskeletal:  No midline back tenderness, step off or crepitus. Left sided lower back tenderness. No CVA tenderness.  Neurological: She is alert. Coordination normal.  Equal muscle tone. 5/5 strength in lower extremities. DTR equal and intact. Negative straight leg test. Antalgic gait.  Skin: Skin is warm and dry. She is not diaphoretic.  Nursing note and vitals reviewed.   ED Course  Procedures (including critical care time) Labs Review Labs Reviewed  CBC WITH DIFFERENTIAL/PLATELET - Abnormal; Notable for the  following:    RBC 6.06 (*)    MCV 68.0 (*)    MCH 21.5 (*)    RDW 16.2 (*)    All other components within normal limits  COMPREHENSIVE METABOLIC PANEL - Abnormal; Notable for the following:    Total Protein 8.4 (*)    All other components within normal limits  URINALYSIS, ROUTINE W REFLEX MICROSCOPIC - Abnormal; Notable for the following:    APPearance CLOUDY (*)    All other components within normal limits  LIPASE, BLOOD  POC URINE  PREG, ED    Imaging Review No results found.   EKG Interpretation None      MDM   Final diagnoses:  Left-sided low back pain without sciatica   Patient presenting with left-sided flank pain as well as low back pain with history of standing on her feet for long period of time. Patient states the pain is worse with ambulation. She has no abdominal symptoms. Vital signs stable. No abdominal tenderness. Lab work reassuring. Patient without red flags for back pain and neurological exam without deficits. Pain appears to be musculoskeletal. No concern for cauda equina. I doubt acute abdominal process. Patient given Toradol as well as Percocet in ED with improvement of her symptoms. Discussed rice protocol and following up with primary care or orthopedics.  Discussed return precautions with patient. Discussed all results and patient verbalizes understanding and agrees with plan.   Louann Sjogren, PA-C 08/04/14 2142  Linwood Dibbles, MD 08/04/14 [phone]

## 2014-08-04 NOTE — ED Notes (Signed)
Pt states that she has been having lt sided abd pain radiating to flank.  States she missed her period this month.  Took a preg test.  It was negative.

## 2014-08-04 NOTE — Discharge Instructions (Signed)
Return to the emergency room with worsening of symptoms, new symptoms or with symptoms that are concerning , especially fevers, loss of control of bladder or bowels, numbness or tingling around genital region or anus, weakness. RICE: Rest, Ice (three cycles of 20 mins on, 65mns off at least twice a day), compression/brace, elevation. Heating pad works well for back pain. Ibuprofen 4051m(2 tablets 20047mevery 5-6 hours for 3-5 days. Follow up with orthopedist if symptoms worsen or are persistent. Call the wellness center to establish care with a primary care provider. Number provided above. Read below information and follow recommendations.   Back Injury Prevention Back injuries can be extremely painful and difficult to heal. After having one back injury, you are much more likely to experience another later on. It is important to learn how to avoid injuring or re-injuring your back. The following tips can help you to prevent a back injury. PHYSICAL FITNESS  Exercise regularly and try to develop good tone in your abdominal muscles. Your abdominal muscles provide a lot of the support needed by your back.  Do aerobic exercises (walking, jogging, biking, swimming) regularly.  Do exercises that increase balance and strength (tai chi, yoga) regularly. This can decrease your risk of falling and injuring your back.  Stretch before and after exercising.  Maintain a healthy weight. The more you weigh, the more stress is placed on your back. For every pound of weight, 10 times that amount of pressure is placed on the back. DIET  Talk to your caregiver about how much calcium and vitamin D you need per day. These nutrients help to prevent weakening of the bones (osteoporosis). Osteoporosis can cause broken (fractured) bones that lead to back pain.  Include good sources of calcium in your diet, such as dairy products, green, leafy vegetables, and products with calcium added (fortified).  Include good  sources of vitamin D in your diet, such as milk and foods that are fortified with vitamin D.  Consider taking a nutritional supplement or a multivitamin if needed.  Stop smoking if you smoke. POSTURE  Sit and stand up straight. Avoid leaning forward when you sit or hunching over when you stand.  Choose chairs with good low back (lumbar) support.  If you work at a desk, sit close to your work so you do not need to lean over. Keep your chin tucked in. Keep your neck drawn back and elbows bent at a right angle. Your arms should look like the letter "L."  Sit high and close to the steering wheel when you drive. Add a lumbar support to your car seat if needed.  Avoid sitting or standing in one position for too long. Take breaks to get up, stretch, and walk around at least once every hour. Take breaks if you are driving for long periods of time.  Sleep on your side with your knees slightly bent, or sleep on your back with a pillow under your knees. Do not sleep on your stomach. LIFTING, TWISTING, AND REACHING  Avoid heavy lifting, especially repetitive lifting. If you must do heavy lifting:  Stretch before lifting.  Work slowly.  Rest between lifts.  Use carts and dollies to move objects when possible.  Make several small trips instead of carrying 1 heavy load.  Ask for help when you need it.  Ask for help when moving big, awkward objects.  Follow these steps when lifting:  Stand with your feet shoulder-width apart.  Get as close to the object as  you can. Do not try to pick up heavy objects that are far from your body.  Use handles or lifting straps if they are available.  Bend at your knees. Squat down, but keep your heels off the floor.  Keep your shoulders pulled back, your chin tucked in, and your back straight.  Lift the object slowly, tightening the muscles in your legs, abdomen, and buttocks. Keep the object as close to the center of your body as possible.  When you  put a load down, use these same guidelines in reverse.  Do not:  Lift the object above your waist.  Twist at the waist while lifting or carrying a load. Move your feet if you need to turn, not your waist.  Bend over without bending at your knees.  Avoid reaching over your head, across a table, or for an object on a high surface. OTHER TIPS  Avoid wet floors and keep sidewalks clear of ice to prevent falls.  Do not sleep on a mattress that is too soft or too hard.  Keep items that are used frequently within easy reach.  Put heavier objects on shelves at waist level and lighter objects on lower or higher shelves.  Find ways to decrease your stress, such as exercise, massage, or relaxation techniques. Stress can build up in your muscles. Tense muscles are more vulnerable to injury.  Seek treatment for depression or anxiety if needed. These conditions can increase your risk of developing back pain. SEEK MEDICAL CARE IF:  You injure your back.  You have questions about diet, exercise, or other ways to prevent back injuries. MAKE SURE YOU:  Understand these instructions.  Will watch your condition.  Will get help right away if you are not doing well or get worse. Document Released: 07/02/2004 Document Revised: 08/17/2011 Document Reviewed: 07/06/2011 [facility] Patient Information 2015 [place], [place]. This information is not intended to replace advice given to you by your health care provider. Make sure you discuss any questions you have with your health care provider.

## 2014-08-05 ENCOUNTER — Emergency Department (HOSPITAL_COMMUNITY)
Admission: EM | Admit: 2014-08-05 | Discharge: 2014-08-05 | Disposition: A | Discharge status: Home / Self Care | Payer: BLUE CROSS/BLUE SHIELD | Attending: Emergency Medicine | Admitting: Emergency Medicine

## 2014-08-05 ENCOUNTER — Encounter (HOSPITAL_COMMUNITY): Payer: Self-pay

## 2014-08-05 DIAGNOSIS — M5442 Lumbago with sciatica, left side: Secondary | ICD-10-CM | POA: Diagnosis not present

## 2014-08-05 DIAGNOSIS — Z791 Long term (current) use of non-steroidal anti-inflammatories (NSAID): Secondary | ICD-10-CM | POA: Insufficient documentation

## 2014-08-05 DIAGNOSIS — R1032 Left lower quadrant pain: Secondary | ICD-10-CM | POA: Insufficient documentation

## 2014-08-05 DIAGNOSIS — Z8619 Personal history of other infectious and parasitic diseases: Secondary | ICD-10-CM | POA: Insufficient documentation

## 2014-08-05 DIAGNOSIS — Z720 Tobacco use: Secondary | ICD-10-CM | POA: Insufficient documentation

## 2014-08-05 DIAGNOSIS — M5432 Sciatica, left side: Secondary | ICD-10-CM

## 2014-08-05 DIAGNOSIS — Z79899 Other long term (current) drug therapy: Secondary | ICD-10-CM | POA: Insufficient documentation

## 2014-08-05 DIAGNOSIS — M549 Dorsalgia, unspecified: Secondary | ICD-10-CM | POA: Diagnosis present

## 2014-08-05 LAB — URINALYSIS, ROUTINE W REFLEX MICROSCOPIC
Bilirubin Urine: NEGATIVE
Glucose, UA: NEGATIVE mg/dL
Hgb urine dipstick: NEGATIVE
Ketones, ur: NEGATIVE mg/dL
LEUKOCYTES UA: NEGATIVE
Nitrite: NEGATIVE
PH: 6.5 (ref 5.0–8.0)
PROTEIN: NEGATIVE mg/dL
SPECIFIC GRAVITY, URINE: 1.026 (ref 1.005–1.030)
UROBILINOGEN UA: 0.2 mg/dL (ref 0.0–1.0)

## 2014-08-05 LAB — COMPREHENSIVE METABOLIC PANEL
ALK PHOS: 60 U/L (ref 39–117)
ALT: 14 U/L (ref 0–35)
AST: 17 U/L (ref 0–37)
Albumin: 3.8 g/dL (ref 3.5–5.2)
Anion gap: 8 (ref 5–15)
BUN: 13 mg/dL (ref 6–23)
CO2: 24 mmol/L (ref 19–32)
Calcium: 8.7 mg/dL (ref 8.4–10.5)
Chloride: 108 mmol/L (ref 96–112)
Creatinine, Ser: 0.67 mg/dL (ref 0.50–1.10)
GFR calc Af Amer: >90 mL/min (ref >90)
GFR calc non Af Amer: >90 mL/min (ref >90)
GLUCOSE: 101 mg/dL — AB (ref 70–99)
POTASSIUM: 3.9 mmol/L (ref 3.5–5.1)
SODIUM: 140 mmol/L (ref 135–145)
TOTAL PROTEIN: 8.3 g/dL (ref 6.0–8.3)
Total Bilirubin: 0.2 mg/dL — ABNORMAL LOW (ref 0.3–1.2)

## 2014-08-05 LAB — CBC WITH DIFFERENTIAL/PLATELET
BASOS ABS: 0 10*3/uL (ref 0.0–0.1)
BASOS PCT: 0 % (ref 0–1)
EOS ABS: 0.1 10*3/uL (ref 0.0–0.7)
EOS PCT: 1 % (ref 0–5)
HCT: 40.6 % (ref 36.0–46.0)
Hemoglobin: 12.7 g/dL (ref 12.0–15.0)
Lymphocytes Relative: 24 % (ref 12–46)
Lymphs Abs: 2 10*3/uL (ref 0.7–4.0)
MCH: 21.4 pg — AB (ref 26.0–34.0)
MCHC: 31.3 g/dL (ref 30.0–36.0)
MCV: 68.4 fL — ABNORMAL LOW (ref 78.0–100.0)
MONOS PCT: 7 % (ref 3–12)
Monocytes Absolute: 0.6 10*3/uL (ref 0.1–1.0)
NEUTROS ABS: 5.6 10*3/uL (ref 1.7–7.7)
NEUTROS PCT: 68 % (ref 43–77)
PLATELETS: 339 10*3/uL (ref 150–400)
RBC: 5.94 MIL/uL — AB (ref 3.87–5.11)
RDW: 16.3 % — AB (ref 11.5–15.5)
WBC: 8.3 10*3/uL (ref 4.0–10.5)

## 2014-08-05 MED ORDER — PREDNISONE 20 MG PO TABS
60.0000 mg | ORAL_TABLET | Freq: Once | ORAL | Status: AC
  Administered 2014-08-05: 60 mg via ORAL
  Filled 2014-08-05: qty 3

## 2014-08-05 MED ORDER — METHOCARBAMOL 500 MG PO TABS: 500.0000 mg | ORAL_TABLET | Freq: Two times a day (BID) | ORAL | Status: DC

## 2014-08-05 MED ORDER — HYDROCODONE-ACETAMINOPHEN 5-325 MG PO TABS: 1.0000 | ORAL_TABLET | Freq: Four times a day (QID) | ORAL | Status: DC | PRN

## 2014-08-05 MED ORDER — NAPROXEN 500 MG PO TABS: 500.0000 mg | ORAL_TABLET | Freq: Two times a day (BID) | ORAL | Status: DC

## 2014-08-05 MED ORDER — METHOCARBAMOL 500 MG PO TABS
500.0000 mg | ORAL_TABLET | Freq: Once | ORAL | Status: AC
  Administered 2014-08-05: 500 mg via ORAL
  Filled 2014-08-05: qty 1

## 2014-08-05 MED ORDER — HYDROMORPHONE HCL 1 MG/ML IJ SOLN
1.0000 mg | Freq: Once | INTRAMUSCULAR | Status: AC
  Administered 2014-08-05: 1 mg via INTRAMUSCULAR
  Filled 2014-08-05: qty 1

## 2014-08-05 MED ORDER — PREDNISONE 10 MG PO TABS: ORAL_TABLET | ORAL | Status: DC

## 2014-08-05 NOTE — Discharge Instructions (Signed)
1. Medications: robaxin, naproxyn, vicodin, prednisone, usual home medications 2. Treatment: rest, drink plenty of fluids, gentle stretching as discussed, alternate ice and heat 3. Follow Up: Please followup with your primary doctor in 3 days for discussion of your diagnoses and further evaluation after today's visit; if you do not have a primary care doctor use the resource guide provided to find one;  Return to the ER for worsening back pain, difficulty walking, loss of bowel or bladder control or other concerning symptoms   Back Exercises Back exercises help treat and prevent back injuries. The goal of back exercises is to increase the strength of your abdominal and back muscles and the flexibility of your back. These exercises should be started when you no longer have back pain. Back exercises include:  Pelvic Tilt. Lie on your back with your knees bent. Tilt your pelvis until the lower part of your back is against the floor. Hold this position 5 to 10 sec and repeat 5 to 10 times.  Knee to Chest. Pull first 1 knee up against your chest and hold for 20 to 30 seconds, repeat this with the other knee, and then both knees. This may be done with the other leg straight or bent, whichever feels better.  Sit-Ups or Curl-Ups. Bend your knees 90 degrees. Start with tilting your pelvis, and do a partial, slow sit-up, lifting your trunk only 30 to 45 degrees off the floor. Take at least 2 to 3 seconds for each sit-up. Do not do sit-ups with your knees out straight. If partial sit-ups are difficult, simply do the above but with only tightening your abdominal muscles and holding it as directed.  Hip-Lift. Lie on your back with your knees flexed 90 degrees. Push down with your feet and shoulders as you raise your hips a couple inches off the floor; hold for 10 seconds, repeat 5 to 10 times.  Back arches. Lie on your stomach, propping yourself up on bent elbows. Slowly press on your hands, causing an arch in  your low back. Repeat 3 to 5 times. Any initial stiffness and discomfort should lessen with repetition over time.  Shoulder-Lifts. Lie face down with arms beside your body. Keep hips and torso pressed to floor as you slowly lift your head and shoulders off the floor. Do not overdo your exercises, especially in the beginning. Exercises may cause you some mild back discomfort which lasts for a few minutes; however, if the pain is more severe, or lasts for more than 15 minutes, do not continue exercises until you see your caregiver. Improvement with exercise therapy for back problems is slow.  See your caregivers for assistance with developing a proper back exercise program. Document Released: 07/02/2004 Document Revised: 08/17/2011 Document Reviewed: 03/26/2011 [facility] Patient Information 2015 [place], [place]. This information is not intended to replace advice given to you by your health care provider. Make sure you discuss any questions you have with your health care provider.   Sciatica Sciatica is pain, weakness, numbness, or tingling along the path of the sciatic nerve. The nerve starts in the lower back and runs down the back of each leg. The nerve controls the muscles in the lower leg and in the back of the knee, while also providing sensation to the back of the thigh, lower leg, and the sole of your foot. Sciatica is a symptom of another medical condition. For instance, nerve damage or certain conditions, such as a herniated disk or bone spur on the spine, pinch  or put pressure on the sciatic nerve. This causes the pain, weakness, or other sensations normally associated with sciatica. Generally, sciatica only affects one side of the body. CAUSES   Herniated or slipped disc.  Degenerative disk disease.  A pain disorder involving the narrow muscle in the buttocks (piriformis syndrome).  Pelvic injury or fracture.  Pregnancy.  Tumor (rare). SYMPTOMS  Symptoms can vary from mild to very  severe. The symptoms usually travel from the low back to the buttocks and down the back of the leg. Symptoms can include:  Mild tingling or dull aches in the lower back, leg, or hip.  Numbness in the back of the calf or sole of the foot.  Burning sensations in the lower back, leg, or hip.  Sharp pains in the lower back, leg, or hip.  Leg weakness.  Severe back pain inhibiting movement. These symptoms may get worse with coughing, sneezing, laughing, or prolonged sitting or standing. Also, being overweight may worsen symptoms. DIAGNOSIS  Your caregiver will perform a physical exam to look for common symptoms of sciatica. He or she may ask you to do certain movements or activities that would trigger sciatic nerve pain. Other tests may be performed to find the cause of the sciatica. These may include:  Blood tests.  X-rays.  Imaging tests, such as an MRI or CT scan. TREATMENT  Treatment is directed at the cause of the sciatic pain. Sometimes, treatment is not necessary and the pain and discomfort goes away on its own. If treatment is needed, your caregiver may suggest:  Over-the-counter medicines to relieve pain.  Prescription medicines, such as anti-inflammatory medicine, muscle relaxants, or narcotics.  Applying heat or ice to the painful area.  Steroid injections to lessen pain, irritation, and inflammation around the nerve.  Reducing activity during periods of pain.  Exercising and stretching to strengthen your abdomen and improve flexibility of your spine. Your caregiver may suggest losing weight if the extra weight makes the back pain worse.  Physical therapy.  Surgery to eliminate what is pressing or pinching the nerve, such as a bone spur or part of a herniated disk. HOME CARE INSTRUCTIONS   Only take over-the-counter or prescription medicines for pain or discomfort as directed by your caregiver.  Apply ice to the affected area for 20 minutes, 3-4 times a day for the  first 48-72 hours. Then try heat in the same way.  Exercise, stretch, or perform your usual activities if these do not aggravate your pain.  Attend physical therapy sessions as directed by your caregiver.  Keep all follow-up appointments as directed by your caregiver.  Do not wear high heels or shoes that do not provide proper support.  Check your mattress to see if it is too soft. A firm mattress may lessen your pain and discomfort. SEEK IMMEDIATE MEDICAL CARE IF:   You lose control of your bowel or bladder (incontinence).  You have increasing weakness in the lower back, pelvis, buttocks, or legs.  You have redness or swelling of your back.  You have a burning sensation when you urinate.  You have pain that gets worse when you lie down or awakens you at night.  Your pain is worse than you have experienced in the past.  Your pain is lasting longer than 4 weeks.  You are suddenly losing weight without reason. MAKE SURE YOU:  Understand these instructions.  Will watch your condition.  Will get help right away if you are not doing well or  get worse. Document Released: 05/19/2001 Document Revised: 11/24/2011 Document Reviewed: 10/04/2011 [facility] Patient Information 2015 [place], [place]. This information is not intended to replace advice given to you by your health care provider. Make sure you discuss any questions you have with your health care provider.    Emergency Department Resource Guide 1) Find a Doctor and Pay Out of Pocket Although you won't have to find out who is covered by your insurance plan, it is a good idea to ask around and get recommendations. You will then need to call the office and see if the doctor you have chosen will accept you as a new patient and what types of options they offer for patients who are self-pay. Some doctors offer discounts or will set up payment plans for their patients who do not have insurance, but you will need to ask so you aren't  surprised when you get to your appointment.  2) Contact Your Local Health Department Not all health departments have doctors that can see patients for sick visits, but many do, so it is worth a call to see if yours does. If you don't know where your local health department is, you can check in your phone book. The CDC also has a tool to help you locate your state's health department, and many state websites also have listings of all of their local health departments.  3) Find a Walk-in Clinic If your illness is not likely to be very severe or complicated, you may want to try a walk in clinic. These are popping up all over the country in pharmacies, drugstores, and shopping centers. They're usually staffed by nurse practitioners or physician assistants that have been trained to treat common illnesses and complaints. They're usually fairly quick and inexpensive. However, if you have serious medical issues or chronic medical problems, these are probably not your best option.  No Primary Care Doctor: - Call Health Connect at  [phone] - they can help you locate a primary care doctor that  accepts your insurance, provides certain services, etc. - Physician Referral Service- [phone]  Chronic Pain Problems: Organization         Address  Phone   Notes  [facility]  [phone] Patients need to be referred by their primary care doctor.   Medication Assistance: Organization         Address  Phone   Notes  [facility] Medication [facility] [address] [place] [place], [place] [address] [phone] --Must be a resident of [facility] -- Must have NO insurance coverage whatsoever (no Medicaid/ Medicare, etc.) -- The pt. MUST have a primary care doctor that directs their care regularly and follows them in the community   [facility]ist  [phone]   [employer]  [phone]    Agencies that provide inexpensive medical care: Organization          Address  Phone   Notes  [facility]  [phone]   [facility]    [phone]   [facility] [address] [place], [place] [address] [phone]   [facility] [address] [place] [place] [phone]   Planned Parenthood    [phone]   [facility]    [phone]   [facility]  [address], [place] Phone:  [phone], Fax:  [phone] Hours of Operation:  9 am -  6 pm, M-F.  Also accepts Medicaid/Medicare and self-pay.  [facility] for [place], [address], [place] Phone: [phone], Fax: [phone]. Hours of Operation:  8:30 am - 5:30 pm, M-F.  Also accepts Medicaid and self-pay.  [facility] [place] [address] [place] Phone: [phone]   [place], [place], [place] [phone], Ext. [phone] Mondays & Thursdays: 7-9 AM.  First 15 patients are seen on a first come, first serve basis.    [place] Providers:  Organization         Address  Phone   Notes  [facility] [address], [place] [phone] Also accepts self-pay patients.  [facility] [address], [place]  [phone]   [place], [address], [place] [phone]   [facility] [address], [place] [phone]   Lucianne Lei [address], [place]   [phone] Only accepts [place] Access [place] patients after they have their name applied to their card.   Self-Pay (no insurance) in [facility]:  Organization         Address  Phone   Notes  Sickle Cell Patients, [facility] Internal Medicine [place] [phone]   [facility] Care [place] [phone]     [facility] [place]  [place], [place], [place] [phone]   [facility]/Dr. Osei-Bonsu  [address] [place] or [address], [place] [phone] Phone number for both [place] and [place] locations is the same.  [facility] [address], [place] [phone]   [facility] [address], [place] or [address] [phone]   [facility] [address] [place] [phone], phone; [phone], fax Sees patients 1st and 3rd Saturday of every month.  Must not qualify for public or private insurance (i.e. Medicaid, Medicare, [place] Health Choice, Veterans' Benefits)  Household income should be no more than 200% of the poverty level The clinic cannot treat you if you are pregnant or think you are pregnant  Sexually transmitted diseases are not treated at the clinic.    Dental Care: Organization         Address  Phone  Notes  [facility] [place] [phone] Accepts children up to age 25 who are enrolled in [place] or [place]; pregnant women with a Medicaid card; and children who have applied for Medicaid or [place] Health Choice, but were declined, whose parents can pay a reduced fee at time of service.  [facility] Department of [facility] [place] [facility] [place]  [address], [place] [phone] Accepts children up to age 52 who are enrolled in [place] or [place]; pregnant women with a Medicaid card; and children who have applied for Medicaid or [place] Health Choice, but were declined, whose parents can pay a reduced fee at time of service.  [facility]  [place] [phone] Patients are seen by appointment only. Walk-ins are not accepted. [facility] will see patients 37  years of age  and older. Monday - Tuesday (8am-5pm) Most Wednesdays (8:30-5pm) $30 per visit, cash only  [facility]  [address], [facility] [phone] Patients are seen by appointment only. Walk-ins are not accepted. [place] will see patients 55 years of age and older. One Wednesday Evening (Monthly: Volunteer Based).  $30 per visit, cash only  [place]  [phone] for adults; Children under age 3, call Graduate Pediatric Dentistry at [phone]. Children aged 57-14, please call [phone] to request a pediatric application.  Dental services are provided in all areas of dental care including fillings, crowns and bridges, complete and partial dentures, implants, gum treatment, root canals, and extractions. Preventive care is also provided. Treatment is provided to both adults and children. Patients are selected via a lottery and there is often a waiting list.   [facility] [address] [place]  [phone] [email or web address]   [facility] [address] [place], [place] [phone], Ext. [phone] Second and Fourth Thursday of each month, opens at 6:30 AM; Clinic ends at 9 AM.  Patients are seen on a first-come first-served basis, and a limited number are seen during each clinic.   [facility]  [address] Hillard Danker [place], [place] [phone]   Eligibility Requirements You must have lived in [place], [place], or [place] counties for at least the last three months.   You cannot be eligible for state or federal sponsored [employer], including [employer], [place], or [employer].   You generally cannot be eligible for healthcare insurance through your employer.    How to apply: Eligibility screenings are held every Tuesday and Wednesday afternoon from 1:00 pm until 4:00 pm. You do not need an appointment for the interview!  [facility]  [address] [place], [place]   [place]  [place] Department  [place]  [phone]    Behavioral Health Resources in the Community: Intensive Outpatient Programs Organization         Address  Phone  Notes  [place]. [address] [place], [place] [phone]   [facility] [address] [place], [place]   ADS: Alcohol & Drug S[facility]cs [address], [place], [place]   [place] [address]  [place], [place] or [phone]   Substance Abuse Resources Organization         Address  Phone  Notes  Alcohol and Drug Services  [phone]   [place]  [phone]   [facility] [place]   Chinita Pester  [phone]   Residential & Outpatient Substance Abuse Program  [phone]   Psychological Services Organization         Address  Phone  Notes  [facility]  [place]  [phone]   [place] [address] [place] [phone] or [phone]    Mobile Crisis Teams Organization         Address  Phone  Notes  [facility], Mobile Crisis Care Unit  [phone]   [facility]  [address] [place], [place]   [facility] [address], [place] [phone]    Self-Help/Support Groups Organization         Address  Phone             Notes  [facility] [place] - variety of support groups  [place] Call for more information  Narcotics Anonymous (NA), [facility] [address], [employer]  2 meetings at this location   Special educational needs teacher         Address  Phone  Notes  [facility] Residential Treatment [place],    [place]   [phone]   [facility]  [address] [place] [id number], [place], [place]   [place], [place] [phone] Admissions: 8am-3pm M-F  Incentives Substance [place] [address]    [place], [place] [phone]   [facility] [address] [place], [place], [place]   [facility] [place] [address]  [place], [place]   [facility] - Intensive Outpatient [address] Kristeen [address], [place], [place]   [facility] ([place]  [place], [place] [phone] or [phone]   [facility] Services (RTS) [address], [place], [place] Accepts Medicaid  [facility] [address],  [place] [phone] Substance Abuse/Addiction Treatment   [facility] Organization         Address  Phone  Notes  [facility]  [phone]   Domenic Schwab, PhD [address] [place], [place]   [phone] or [phone]   [place], [place] [phone]   [facility] [address], [place], [place] [phone] Insurance/Medicaid/sponsorship through [facility] and Families [address]                                    [place], [place] [phone] [place] [address], [place] [phone]    Dr. Adele Schilder  [phone]   [facility] Dept. 1) [address], [place] 2) [place] 3)  [address] [address], [place] [phone]  [phone]   [place] [phone] or [phone] (After Hours)

## 2014-08-05 NOTE — ED Notes (Signed)
Awake. Verbally responsive. A/O x4. Resp even and unlabored. No audible adventitious breath sounds noted. ABC's intact.  

## 2014-08-05 NOTE — ED Provider Notes (Signed)
CSN: [id number]     Arrival date & time 08/05/14  1823 History   First MD Initiated Contact with Patient 08/05/14 2135     Chief Complaint  Patient presents with  . Abdominal Pain  . Back Pain  . Leg Pain     (Consider location/radiation/quality/duration/timing/severity/associated sxs/prior Treatment) Patient is a 44 y.o. female presenting with abdominal pain, back pain, and leg pain. The history is provided by the patient and medical records. No language interpreter was used.  Abdominal Pain Associated symptoms: no chest pain, no constipation, no cough, no diarrhea, no dysuria, no fatigue, no fever, no hematuria, no nausea, no shortness of breath and no vomiting   Back Pain Associated symptoms: abdominal pain and leg pain   Associated symptoms: no chest pain, no dysuria, no fever, no headaches, no numbness and no weakness   Leg Pain Associated symptoms: back pain   Associated symptoms: no fatigue, no fever and no neck pain       Michaela Carr is a 44 y.o. female  with no major medical Hx presents to the Emergency Department complaining of gradual, persistent, left low back, left buttock and left posterior thigh pain onset 3 days ago. Patient reports that sometimes she feels the dullness of the pain in her left lower quadrant. She reports it's been constant for 2 days without worsening. She reports that pain is aggravated by movement and palpation but especially by walking up stairs. She reports that she stands for 11 hours on her feet at work each day.  She denies fever, chills, headache, neck pain, chest pain, shortness of breath, nausea, vomiting, diarrhea, weakness, dizziness, syncope, dysuria, hematuria, weight loss, loss of bowel or bladder control, saddle anesthesia, anticoagulation, personal history of cancer, IV drug use.  Patient reports that the pain from her left lower back radiates to her hip and into the posterior left thigh numbness associated with a tingling sensation in that  area. Pain and paresthesias do not extend below the knee. She reports she ambulates with pain but without difficulty.  LMP: Last week. Last bowel movement was this morning and was normal; she denies straining for bowel movements.  Past Medical History  Diagnosis Date  . STD (sexually transmitted disease)     trichomonas   Past Surgical History  Procedure Laterality Date  . Breast surgery  1997    breast reduction  . Laparoscopic gastric banding  2010    done by [facility]   Family History  Problem Relation Age of Onset  . Hypertension Father   . Diabetes Maternal Grandmother    History  Substance Use Topics  . Smoking status: Current Every Day Smoker -- 0.50 packs/day  . Smokeless tobacco: Not on file  . Alcohol Use: Yes     Comment: social   OB History    Gravida Para Term Preterm AB TAB SAB Ectopic Multiple Living   Review of Systems  Constitutional: Negative for fever, diaphoresis, appetite change, fatigue and unexpected weight change.  HENT: Negative for mouth sores.   Eyes: Negative for visual disturbance.  Respiratory: Negative for cough, chest tightness, shortness of breath and wheezing.   Cardiovascular: Negative for chest pain.  Gastrointestinal: Positive for abdominal pain. Negative for nausea, vomiting, diarrhea and constipation.  Endocrine: Negative for polydipsia, polyphagia and polyuria.  Genitourinary: Negative for dysuria, urgency, frequency and hematuria.  Musculoskeletal: Positive for back pain. Negative for  joint swelling, gait problem, neck pain and neck stiffness.  Skin: Negative for rash.  Allergic/Immunologic: Negative for immunocompromised state.  Neurological: Negative for syncope, weakness, light-headedness, numbness and headaches.  Hematological: Does not bruise/bleed easily.  Psychiatric/Behavioral: Negative for sleep disturbance. The patient is not nervous/anxious.   All other systems reviewed and are  negative.     Allergies  Review of patient's allergies indicates no known allergies.  Home Medications   Prior to Admission medications   Medication Sig Start Date End Date Taking? Authorizing Provider  HYDROcodone-acetaminophen (NORCO/VICODIN) 5-325 MG per tablet Take 1-2 tablets by mouth every 6 (six) hours as needed for moderate pain or severe pain. 08/05/14   Mikey Maffett, PA-C  meloxicam (MOBIC) 7.5 MG tablet TAKE 1 TABLET BY MOUTH DAILY Patient not taking: Reported on 08/04/2014 01/24/14   Elvina Sidle, MD  methocarbamol (ROBAXIN) 500 MG tablet Take 1 tablet (500 mg total) by mouth 2 (two) times daily. 08/05/14   Laretha Luepke, PA-C  naproxen (NAPROSYN) 500 MG tablet Take 1 tablet (500 mg total) by mouth 2 (two) times daily with a meal. 08/05/14   Jairen Goldfarb, PA-C  predniSONE (DELTASONE) 10 MG tablet 4 tabs po daily x 3 days, then 3 tabs x 3 days, then 2 tabs x 3 days, then 1 tab x 3 days, then 0.5 tabs x 4 days 08/05/14   Dahlia Client Felisha Claytor, PA-C  triamterene-hydrochlorothiazide (MAXZIDE-25) 37.5-25 MG per tablet Take 1 tablet by mouth daily. Patient not taking: Reported on 08/04/2014 12/25/13   Elvina Sidle, MD   BP 131/74 mmHg  Pulse 79  Temp(Src) 98.8 F (37.1 C) (Oral)  Resp 18  SpO2 98%  LMP 06/21/2014 (Exact Date) Physical Exam  Constitutional: She appears well-developed and well-nourished. No distress.  HENT:  Head: Normocephalic and atraumatic.  Mouth/Throat: Oropharynx is clear and moist. No oropharyngeal exudate.  Eyes: Conjunctivae are normal.  Neck: Normal range of motion. Neck supple.  Full ROM without pain  Cardiovascular: Normal rate, regular rhythm, normal heart sounds and intact distal pulses.   No murmur heard. Pulmonary/Chest: Effort normal and breath sounds normal. No respiratory distress. She has no wheezes.  Abdominal: Soft. She exhibits no distension. There is no tenderness.  Abdomen is soft and nontender, specifically in the  left lower quadrant No pain to palpation of the left groin No inguinal hernia, no lymphadenopathy   Musculoskeletal:  Full range of motion of the T-spine and L-spine No tenderness to palpation of the spinous processes of the T-spine or L-spine Tenderness to palpation of the left paraspinous muscles of the L-spine, particularly over the left SI joint Full range of motion of the left hip with moderate pain in the left buttock and over the left SI joint Full range of motion of the left knee and ankle without pain Positive straight leg raise on the left  Lymphadenopathy:    She has no cervical adenopathy.  Neurological: She is alert. She has normal reflexes.  Reflex Scores:      Bicep reflexes are 2+ on the right side and 2+ on the left side.      Brachioradialis reflexes are 2+ on the right side and 2+ on the left side.      Patellar reflexes are 2+ on the right side and 2+ on the left side.      Achilles reflexes are 2+ on the right side and 2+ on the left side. Speech is clear and goal oriented, follows commands Normal 5/5 strength in upper and  lower extremities bilaterally including dorsiflexion and plantar flexion, strong and equal grip strength Sensation normal to light and sharp touch Moves extremities without ataxia, coordination intact Normal gait Normal balance No Clonus   Skin: Skin is warm and dry. No rash noted. She is not diaphoretic. No erythema.  Psychiatric: She has a normal mood and affect. Her behavior is normal.  Nursing note and vitals reviewed.   ED Course  Procedures (including critical care time) Labs Review Labs Reviewed  CBC WITH DIFFERENTIAL/PLATELET - Abnormal; Notable for the following:    RBC 5.94 (*)    MCV 68.4 (*)    MCH 21.4 (*)    RDW 16.3 (*)    All other components within normal limits  COMPREHENSIVE METABOLIC PANEL - Abnormal; Notable for the following:    Glucose, Bld 101 (*)    Total Bilirubin 0.2 (*)    All other components within  normal limits  URINALYSIS, ROUTINE W REFLEX MICROSCOPIC - Abnormal; Notable for the following:    APPearance CLOUDY (*)    All other components within normal limits  POC URINE PREG, ED    Imaging Review No results found.   EKG Interpretation None      MDM   Final diagnoses:  Sciatica, left  Left-sided low back pain with left-sided sciatica   Sharrell Kurika L Kalp presents with left lower back and buttock pain. Patient reports left lower quadrant abdominal pain in her history of present illness however on exam she has no pain to palpation in this area, no reproducible pain with movement in that area and reports only a vague sensation of dullness there.  Normal neurological exam, no evidence of urinary incontinence or retention, pain is consistently reproducible. There is no evidence of AAA or concern for dissection at this time.   Patient can walk but states is painful.  No loss of bowel or bladder control.  No concern for cauda equina.  No fever, night sweats, weight loss, h/o cancer, IVDU. Abdomen is soft and nontender. Normal bowel movements at home. No reported melena or hematochezia. Pain not consistent with diverticulitis, ovarian torsion. Patient denies urinary or vaginal symptoms. Pain treated here in the department with adequate improvement. RICE protocol and pain medicine indicated and discussed with patient. I have also discussed reasons to return immediately to the ER.    I have personally reviewed patient's vitals, nursing note and any pertinent labs or imaging.  I performed an focused physical exam; undressed when appropriate .    It has been determined that no acute conditions requiring further emergency intervention are present at this time. The patient/guardian have been advised of the diagnosis and plan. I reviewed any labs and imaging including any potential incidental findings. We have discussed signs and symptoms that warrant return to the ED and they are listed in the discharge  instructions.    Vital signs are stable at discharge.   BP 131/74 mmHg  Pulse 79  Temp(Src) 98.8 F (37.1 C) (Oral)  Resp 18  SpO2 98%  LMP 06/21/2014 (Exact Date)        Dierdre ForthHannah Graciana Sessa, PA-C 08/06/14 0148  Linwood DibblesJon Knapp, MD 08/07/14 [phone]

## 2014-08-05 NOTE — ED Notes (Addendum)
Per EMS, Pt, from home, c/o LLQ abdominal pain, L lower back pain, and L upper leg pain x 3 days.  Pain score 10/10.  [facility].     When asked about what is different from yesterday, Pt responded "ain't nothing changed.  The pain is still severe.  I want an ultrasound or something."

## 2014-08-05 NOTE — ED Notes (Signed)
Awake. Verbally responsive. A/O x4. Resp even and unlabored. No audible adventitious breath sounds noted. ABC's intact. Family at bedside. 

## 2014-08-09 ENCOUNTER — Ambulatory Visit: Payer: Self-pay | Admitting: Family Medicine

## 2014-10-03 ENCOUNTER — Ambulatory Visit (HOSPITAL_BASED_OUTPATIENT_CLINIC_OR_DEPARTMENT_OTHER): Payer: BLUE CROSS/BLUE SHIELD

## 2014-10-05 ENCOUNTER — Ambulatory Visit (HOSPITAL_BASED_OUTPATIENT_CLINIC_OR_DEPARTMENT_OTHER): Payer: BLUE CROSS/BLUE SHIELD | Attending: Otolaryngology

## 2014-10-05 VITALS — Ht 67.0 in | Wt 265.0 lb

## 2014-10-05 DIAGNOSIS — G4710 Hypersomnia, unspecified: Secondary | ICD-10-CM | POA: Diagnosis present

## 2014-10-05 DIAGNOSIS — G4733 Obstructive sleep apnea (adult) (pediatric): Secondary | ICD-10-CM | POA: Diagnosis not present

## 2014-10-05 DIAGNOSIS — G4730 Sleep apnea, unspecified: Secondary | ICD-10-CM

## 2014-10-05 DIAGNOSIS — R0683 Snoring: Secondary | ICD-10-CM | POA: Diagnosis not present

## 2014-10-19 DIAGNOSIS — G4730 Sleep apnea, unspecified: Secondary | ICD-10-CM | POA: Diagnosis not present

## 2014-10-19 NOTE — Sleep Study (Signed)
   NAME: Michaela Carr DATE OF BIRTH:  05/01/1971 MEDICAL RECORD NUMBER [id number]  LOCATION: [facility]  PHYSICIAN: Armour Villanueva D  DATE OF STUDY: 10/05/2014  SLEEP STUDY TYPE: Nocturnal Polysomnogram               REFERRING PHYSICIAN: Serena Colonelosen, Jefry, MD  INDICATION FOR STUDY: Hypersomnia with sleep apnea  EPWORTH SLEEPINESS SCORE:   12/24 HEIGHT: 5\' 7"  (170.2 cm)  WEIGHT: 265 lb (120.203 kg)    Body mass index is 41.5 kg/(m^2).  NECK SIZE: 15 in.  MEDICATIONS: Charted for review  SLEEP ARCHITECTURE: Total sleep time 288 minutes with sleep efficiency 74.8%. Stage I was 5.9%, stage II 84.2%, stage III absent, REM 9.9% of total sleep time. Sleep latency 46.5 minutes, REM latency 74.5 minutes, awake after sleep onset 49.5 minutes, arousal index 3.8, bedtime medication: None  RESPIRATORY DATA: Apnea hypopnea index (AHI) 24.0 per hour. 115 total events scored including 31 obstructive apneas, 1 central apnea, 83 hypopneas. Most events were while supine. REM AHI 94.7 per hour. This study was ordered as a diagnostic polysomnogram protocol without CPAP.  OXYGEN DATA: Loud snoring with oxygen desaturation to a nadir of 91% and mean saturation 98.7% on room air  CARDIAC DATA: Sinus rhythm  MOVEMENT/PARASOMNIA: A few incidental limb jerks were noted with no apparent effect on sleep. Bathroom 1  IMPRESSION/ RECOMMENDATION:   1) Moderate obstructive sleep apnea/hypopnea syndrome, AHI 24.0 per hour. Mostly supine events during REM. REM AHI 94.7 per hour. Loud snoring with oxygen desaturation to a nadir of 91% and mean saturation 98.7% on room air. 2) This was ordered as a diagnostic polysomnogram. The patient can return for dedicated CPAP titration study if appropriate.                                           Waymon BudgeYOUNG,Anaise Sterbenz D Diplomate, American Board of Sleep Medicine  ELECTRONICALLY SIGNED ON:  10/19/2014, 8:12 PM [facility] PH: [phone]    FX: [phone] ACCREDITED BY THE AMERICAN ACADEMY OF SLEEP MEDICINE

## 2014-11-15 ENCOUNTER — Ambulatory Visit (INDEPENDENT_AMBULATORY_CARE_PROVIDER_SITE_OTHER): Payer: BLUE CROSS/BLUE SHIELD | Admitting: Family Medicine

## 2014-11-15 ENCOUNTER — Telehealth: Payer: Self-pay

## 2014-11-15 VITALS — BP 128/76 | HR 94 | Temp 97.4°F | Resp 17 | Ht 68.0 in | Wt 278.0 lb

## 2014-11-15 DIAGNOSIS — J069 Acute upper respiratory infection, unspecified: Secondary | ICD-10-CM | POA: Diagnosis not present

## 2014-11-15 MED ORDER — IPRATROPIUM BROMIDE 0.03 % NA SOLN: 2.0000 | Freq: Four times a day (QID) | NASAL | Status: DC

## 2014-11-15 MED ORDER — GUAIFENESIN ER 1200 MG PO TB12: 1.0000 | ORAL_TABLET | Freq: Two times a day (BID) | ORAL | Status: DC | PRN

## 2014-11-15 MED ORDER — CHLORPHENIRAMINE MALEATE 4 MG PO TABS: 4.0000 mg | ORAL_TABLET | Freq: Two times a day (BID) | ORAL | Status: DC | PRN

## 2014-11-15 NOTE — Progress Notes (Signed)
Subjective:  This chart was scribed for Norberto Sorenson MD, by Veverly Fells, at [facility].  This patient was seen in room 4 and the patient's care was started at 1:48 PM.    Chief Complaint  Patient presents with  . Sore Throat  . URI  . Generalized Body Aches     Patient ID: Michaela Carr, female    DOB: 05-10-1971, 44 y.o.   MRN: [id number]  HPI  HPI Comments: Michaela Carr is a 44 y.o. female who presents to the [facility] complaining of a sore throat, congestion, non productive cough, body aches and headache onset two days ago.  Patient denies any loss of appetite,chest pain or shortness of breath.  Patient has taken Nyquil, Theraflu and zyrtec but denies any relief. She states that she has not had a cold in a long time.  She has not been abel to go to work for the past two days and is requesting a doctors note. Patient has a history of sciatica. She has no other complaints today.    Past Medical History  Diagnosis Date  . STD (sexually transmitted disease)     trichomonas   Current Outpatient Prescriptions on File Prior to Visit  Medication Sig Dispense Refill  . HYDROcodone-acetaminophen (NORCO/VICODIN) 5-325 MG per tablet Take 1-2 tablets by mouth every 6 (six) hours as needed for moderate pain or severe pain. 15 tablet 0   No current facility-administered medications on file prior to visit.    No Known Allergies   Review of Systems  Constitutional: Negative for fever, chills and appetite change.  HENT: Positive for congestion and sore throat.   Eyes: Negative for pain, discharge and itching.  Respiratory: Positive for cough. Negative for choking.   Gastrointestinal: Negative for nausea and vomiting.  Neurological: Positive for headaches. Negative for syncope and speech difficulty.       Objective:   Physical Exam  HENT:  Right Ear: Tympanic membrane is erythematous. Tympanic membrane is not injected and not bulging. No  middle ear effusion.  Left Ear: Tympanic membrane is erythematous. Tympanic membrane is not injected and not bulging.  No middle ear effusion.  Mild erythema on TMs bilaterally  nasal mucosa looks good Oropharynx with edema  2+ tonsils, no erythema or exudate.     Neck:  No submandibular, cervical or tonsillar adenopathy.   Question of thyromegaly.   Cardiovascular: Normal rate, regular rhythm, S1 normal, S2 normal and normal heart sounds.  Exam reveals no friction rub.   No murmur heard. Pulmonary/Chest: Effort normal and breath sounds normal. No respiratory distress. She has no wheezes. She has no rales.  Lymphadenopathy:    She has no cervical adenopathy.    Filed Vitals:   11/15/14 1246  BP: 128/76  Pulse: 94  Temp: 97.4 F (36.3 C)  TempSrc: Oral  Resp: 17  Height: 5\' 8"  (1.727 m)  Weight: 278 lb (126.1 kg)  SpO2: 97%          Assessment & Plan:  Patient was instructed to call in on Sunday if symptoms are not improving and will call in amoxicillin.   1. Acute upper respiratory infection     Meds ordered this encounter  Medications  . DISCONTD: ipratropium (ATROVENT) 0.03 % nasal spray    Sig: Place 2 sprays into the nose 4 (four) times daily.    Dispense:  30 mL    Refill:  1  . DISCONTD:  chlorpheniramine (CHLORPHEN) 4 MG tablet    Sig: Take 1 tablet (4 mg total) by mouth 2 (two) times daily as needed for allergies.    Dispense:  14 tablet    Refill:  0  . DISCONTD: Guaifenesin (MUCINEX MAXIMUM STRENGTH) 1200 MG TB12    Sig: Take 1 tablet (1,200 mg total) by mouth every 12 (twelve) hours as needed.    Dispense:  14 tablet    Refill:  1    I personally performed the services described in this documentation, which was scribed in my presence. The recorded information has been reviewed and considered, and addended by me as needed.  Norberto Sorenson, MD MPH

## 2014-11-15 NOTE — Telephone Encounter (Signed)
Patient was just seen and realized she told us the wrong pharmacy. The correct pharmacy is [facility] on [address]. Please resend medications. She states that she doesn't wants to transfer the meds.

## 2014-11-15 NOTE — Telephone Encounter (Signed)
Meds sent. Pt notified. 

## 2014-11-15 NOTE — Patient Instructions (Signed)
Upper Respiratory Infection, Adult An upper respiratory infection (URI) is also sometimes known as the common cold. The upper respiratory tract includes the nose, sinuses, throat, trachea, and bronchi. Bronchi are the airways leading to the lungs. Most people improve within 1 week, but symptoms can last up to 2 weeks. A residual cough may last even longer.  CAUSES Many different viruses can infect the tissues lining the upper respiratory tract. The tissues become irritated and inflamed and often become very moist. Mucus production is also common. A cold is contagious. You can easily spread the virus to others by oral contact. This includes kissing, sharing a glass, coughing, or sneezing. Touching your mouth or nose and then touching a surface, which is then touched by another person, can also spread the virus. SYMPTOMS  Symptoms typically develop 1 to 3 days after you come in contact with a cold virus. Symptoms vary from person to person. They may include:  Runny nose.  Sneezing.  Nasal congestion.  Sinus irritation.  Sore throat.  Loss of voice (laryngitis).  Cough.  Fatigue.  Muscle aches.  Loss of appetite.  Headache.  Low-grade fever. DIAGNOSIS  You might diagnose your own cold based on familiar symptoms, since most people get a cold 2 to 3 times a year. Your caregiver can confirm this based on your exam. Most importantly, your caregiver can check that your symptoms are not due to another disease such as strep throat, sinusitis, pneumonia, asthma, or epiglottitis. Blood tests, throat tests, and X-rays are not necessary to diagnose a common cold, but they may sometimes be helpful in excluding other more serious diseases. Your caregiver will decide if any further tests are required. RISKS AND COMPLICATIONS  You may be at risk for a more severe case of the common cold if you smoke cigarettes, have chronic heart disease (such as heart failure) or lung disease (such as asthma), or if  you have a weakened immune system. The very young and very old are also at risk for more serious infections. Bacterial sinusitis, middle ear infections, and bacterial pneumonia can complicate the common cold. The common cold can worsen asthma and chronic obstructive pulmonary disease (COPD). Sometimes, these complications can require emergency medical care and may be life-threatening. PREVENTION  The best way to protect against getting a cold is to practice good hygiene. Avoid oral or hand contact with people with cold symptoms. Wash your hands often if contact occurs. There is no clear evidence that vitamin C, vitamin E, echinacea, or exercise reduces the chance of developing a cold. However, it is always recommended to get plenty of rest and practice good nutrition. TREATMENT  Treatment is directed at relieving symptoms. There is no cure. Antibiotics are not effective, because the infection is caused by a virus, not by bacteria. Treatment may include:  Increased fluid intake. Sports drinks offer valuable electrolytes, sugars, and fluids.  Breathing heated mist or steam (vaporizer or shower).  Eating chicken soup or other clear broths, and maintaining good nutrition.  Getting plenty of rest.  Using gargles or lozenges for comfort.  Controlling fevers with ibuprofen or acetaminophen as directed by your caregiver.  Increasing usage of your inhaler if you have asthma. Zinc gel and zinc lozenges, taken in the first 24 hours of the common cold, can shorten the duration and lessen the severity of symptoms. Pain medicines may help with fever, muscle aches, and throat pain. A variety of non-prescription medicines are available to treat congestion and runny nose. Your caregiver   can make recommendations and may suggest nasal or lung inhalers for other symptoms.  HOME CARE INSTRUCTIONS   Only take over-the-counter or prescription medicines for pain, discomfort, or fever as directed by your  caregiver.  Use a warm mist humidifier or inhale steam from a shower to increase air moisture. This may keep secretions moist and make it easier to breathe.  Drink enough water and fluids to keep your urine clear or pale yellow.  Rest as needed.  Return to work when your temperature has returned to normal or as your caregiver advises. You may need to stay home longer to avoid infecting others. You can also use a face mask and careful hand washing to prevent spread of the virus. SEEK MEDICAL CARE IF:   After the first few days, you feel you are getting worse rather than better.  You need your caregiver's advice about medicines to control symptoms.  You develop chills, worsening shortness of breath, or brown or red sputum. These may be signs of pneumonia.  You develop yellow or brown nasal discharge or pain in the face, especially when you bend forward. These may be signs of sinusitis.  You develop a fever, swollen neck glands, pain with swallowing, or white areas in the back of your throat. These may be signs of strep throat. SEEK IMMEDIATE MEDICAL CARE IF:   You have a fever.  You develop severe or persistent headache, ear pain, sinus pain, or chest pain.  You develop wheezing, a prolonged cough, cough up blood, or have a change in your usual mucus (if you have chronic lung disease).  You develop sore muscles or a stiff neck. Document Released: 11/18/2000 Document Revised: 08/17/2011 Document Reviewed: 08/30/2013 Exit[employer] Patient Information 2015 [employer]. This information is not intended to replace advice given to you by your health care provider. Make sure you discuss any questions you have with your health care provider.  

## 2014-11-18 ENCOUNTER — Telehealth: Payer: Self-pay

## 2014-11-18 NOTE — Telephone Encounter (Signed)
Pt was seen for an illness on 11/15/14 by Dr. Clelia Croft and was told to call this weekend if her symptoms did not improve so that an antibiotic could be called in.

## 2014-11-19 MED ORDER — AMOXICILLIN 875 MG PO TABS: 875.0000 mg | ORAL_TABLET | Freq: Two times a day (BID) | ORAL | Status: DC

## 2014-11-19 NOTE — Telephone Encounter (Incomplete)
Expand All Collapse All     Subjective:  This chart was scribed for Norberto Sorenson MD, by Veverly Fells, at [facility]. This patient was seen in room 4 and the patient's care was started at 1:48 PM.    Chief Complaint  Patient presents with  . Sore Throat  . URI  . Generalized Body Aches     Patient ID: Michaela Carr, female DOB: 23-Jan-1971, 44 y.o. MRN: [id number]  HPI  HPI Comments: Michaela Carr is a 44 y.o. female who presents to the [facility] complaining of a sore throat, congestion, non productive cough, body aches and headache onset two days ago. Patient denies any loss of appetite,chest pain or shortness of breath. Patient has taken Nyquil, Theraflu and zyrtec but denies any relief. She states that she has not had a cold in a long time. She has not been abel to go to work for the past two days and is requesting a doctors note. Patient has a history of sciatica. She has no other complaints today.    Past Medical History  Diagnosis Date  . STD (sexually transmitted disease)     trichomonas   Current Outpatient Prescriptions on File Prior to Visit  Medication Sig Dispense Refill  . HYDROcodone-acetaminophen (NORCO/VICODIN) 5-325 MG per tablet Take 1-2 tablets by mouth every 6 (six) hours as needed for moderate pain or severe pain. 15 tablet 0   No current facility-administered medications on file prior to visit.    No Known Allergies   Review of Systems  Constitutional: Negative for fever, chills and appetite change.  HENT: Positive for congestion and sore throat.  Eyes: Negative for pain, discharge and itching.  Respiratory: Positive for cough. Negative for choking.  Gastrointestinal: Negative for nausea and vomiting.  Neurological: Positive for headaches. Negative for syncope and speech difficulty.       Objective:   Physical Exam  HENT:  Right Ear: Tympanic membrane is  erythematous. Tympanic membrane is not injected and not bulging. No middle ear effusion.  Left Ear: Tympanic membrane is erythematous. Tympanic membrane is not injected and not bulging. No middle ear effusion.  Mild erythema on TMs bilaterally  nasal mucosa looks good Oropharynx with edema  2+ tonsils, no erythema or exudate.   Neck:  No submandibular, cervical or tonsillar adenopathy.  Question of thyromegaly.  Cardiovascular: Normal rate, regular rhythm, S1 normal, S2 normal and normal heart sounds. Exam reveals no friction rub.  No murmur heard. Pulmonary/Chest: Effort normal and breath sounds normal. No respiratory distress. She has no wheezes. She has no rales.  Lymphadenopathy:   She has no cervical adenopathy.    Filed Vitals:   11/15/14 1246  BP: 128/76  Pulse: 94  Temp: 97.4 F (36.3 C)  TempSrc: Oral  Resp: 17  Height: 5\' 8"  (1.727 m)  Weight: 278 lb (126.1 kg)  SpO2: 97%          Assessment & Plan:   {Add scribe attestation statement}  Patient was instructed to call in on Sunday if symptoms are not improving and will call in amoxicillin.          Revision History         Called pt, advised Rx sent in on voicemail.

## 2014-11-19 NOTE — Telephone Encounter (Signed)
As per Dr. Alver Fisher note. Since patient is still symptomatic, will rx amoxicillin to cover for bacterial infection. Please let patient know script was sent, take amoxicillin twice daily with food for 10 days. Thank you!

## 2015-01-02 ENCOUNTER — Ambulatory Visit (HOSPITAL_BASED_OUTPATIENT_CLINIC_OR_DEPARTMENT_OTHER): Payer: BLUE CROSS/BLUE SHIELD

## 2015-02-21 ENCOUNTER — Other Ambulatory Visit: Payer: Self-pay

## 2015-02-21 MED ORDER — IPRATROPIUM BROMIDE 0.03 % NA SOLN: 2.0000 | Freq: Four times a day (QID) | NASAL | Status: DC

## 2015-02-22 ENCOUNTER — Other Ambulatory Visit: Payer: Self-pay

## 2015-02-22 MED ORDER — IPRATROPIUM BROMIDE 0.03 % NA SOLN: 2.0000 | Freq: Four times a day (QID) | NASAL | Status: DC

## 2015-06-04 ENCOUNTER — Encounter (HOSPITAL_COMMUNITY): Payer: Self-pay | Admitting: Emergency Medicine

## 2015-06-04 ENCOUNTER — Emergency Department (HOSPITAL_COMMUNITY): Payer: BLUE CROSS/BLUE SHIELD

## 2015-06-04 ENCOUNTER — Emergency Department (HOSPITAL_COMMUNITY)
Admission: EM | Admit: 2015-06-04 | Discharge: 2015-06-05 | Disposition: A | Discharge status: Home / Self Care | Payer: BLUE CROSS/BLUE SHIELD | Attending: Emergency Medicine | Admitting: Emergency Medicine

## 2015-06-04 DIAGNOSIS — R079 Chest pain, unspecified: Secondary | ICD-10-CM | POA: Diagnosis not present

## 2015-06-04 DIAGNOSIS — F17200 Nicotine dependence, unspecified, uncomplicated: Secondary | ICD-10-CM | POA: Insufficient documentation

## 2015-06-04 DIAGNOSIS — M542 Cervicalgia: Secondary | ICD-10-CM | POA: Insufficient documentation

## 2015-06-04 DIAGNOSIS — Z8619 Personal history of other infectious and parasitic diseases: Secondary | ICD-10-CM | POA: Diagnosis not present

## 2015-06-04 DIAGNOSIS — M79602 Pain in left arm: Secondary | ICD-10-CM | POA: Diagnosis not present

## 2015-06-04 DIAGNOSIS — M7918 Myalgia, other site: Secondary | ICD-10-CM

## 2015-06-04 LAB — CBC
HEMATOCRIT: 43.1 % (ref 36.0–46.0)
Hemoglobin: 13.4 g/dL (ref 12.0–15.0)
MCH: 21.3 pg — AB (ref 26.0–34.0)
MCHC: 31.1 g/dL (ref 30.0–36.0)
MCV: 68.4 fL — ABNORMAL LOW (ref 78.0–100.0)
Platelets: 332 10*3/uL (ref 150–400)
RBC: 6.3 MIL/uL — ABNORMAL HIGH (ref 3.87–5.11)
RDW: 17.3 % — ABNORMAL HIGH (ref 11.5–15.5)
WBC: 8.2 10*3/uL (ref 4.0–10.5)

## 2015-06-04 LAB — BASIC METABOLIC PANEL
Anion gap: 10 (ref 5–15)
BUN: 12 mg/dL (ref 6–20)
CO2: 25 mmol/L (ref 22–32)
Calcium: 9.3 mg/dL (ref 8.9–10.3)
Chloride: 104 mmol/L (ref 101–111)
Creatinine, Ser: 0.61 mg/dL (ref 0.44–1.00)
GFR calc Af Amer: >60 mL/min (ref >60)
GFR calc non Af Amer: >60 mL/min (ref >60)
GLUCOSE: 95 mg/dL (ref 65–99)
Potassium: 3.8 mmol/L (ref 3.5–5.1)
Sodium: 139 mmol/L (ref 135–145)

## 2015-06-04 NOTE — ED Notes (Signed)
Pt c/o lt sided CP and lt arm pain since last night.  Describes it as an ache.  Denies NVD.  Denies SOB.

## 2015-06-05 LAB — I-STAT TROPONIN, ED: Troponin i, poc: 0 ng/mL (ref 0.00–0.08)

## 2015-06-05 MED ORDER — NAPROXEN 500 MG PO TABS: 500.0000 mg | ORAL_TABLET | Freq: Two times a day (BID) | ORAL | Status: DC

## 2015-06-05 MED ORDER — CYCLOBENZAPRINE HCL 10 MG PO TABS: 10.0000 mg | ORAL_TABLET | Freq: Two times a day (BID) | ORAL | Status: DC | PRN

## 2015-06-05 MED ORDER — KETOROLAC TROMETHAMINE 60 MG/2ML IM SOLN
60.0000 mg | Freq: Once | INTRAMUSCULAR | Status: AC
  Administered 2015-06-05: 60 mg via INTRAMUSCULAR
  Filled 2015-06-05: qty 2

## 2015-06-05 NOTE — ED Provider Notes (Signed)
CSN: [id number]     Arrival date & time 06/04/15  1735 History  By signing my name below, I, Phillis HaggisGabriella Gaje, attest that this documentation has been prepared under the direction and in the presence of Alvira MondayErin Bridgid Printz, MD. Electronically Signed: Phillis HaggisGabriella Gaje, ED Scribe. 06/05/2015. 1:03 AM.   Chief Complaint  Patient presents with  . Chest Pain  . Arm Pain   Patient is a 44 y.o. female presenting with chest pain and arm pain. The history is provided by the patient. No language interpreter was used.  Chest Pain Pain location:  L chest Pain quality: aching   Pain radiates to:  L arm and neck Pain radiates to the back: yes   Pain severity:  Mild Onset quality:  Sudden Duration:  1 day Timing:  Constant Progression:  Worsening Chronicity:  New Ineffective treatments: Tylenol. Associated symptoms: no abdominal pain, no back pain, no cough, no diaphoresis, no fever, no headache, no nausea, no shortness of breath and not vomiting   Risk factors: no birth control, no diabetes mellitus, no hypertension and no prior DVT/PE   Arm Pain Associated symptoms include chest pain. Pertinent negatives include no abdominal pain, no headaches and no shortness of breath.  HPI Comments: Sharrell Kurika L Zazueta is a 44 y.o. female who presents to the Emergency Department complaining of constant, gradually resolving, aching, throbbing left sided chest pain and sore, gradually worsening left arm pain onset one day ago. She states that the pain kept her up throughout the night and was unable to go to work. She states that she has been guarding her arm to lessen pain. She reports worsening pain with getting up and walking around. She states that she has taken Tylenol and medication for sciatica to no relief. She denies any recent change in activity, recent long travel, estrogen use, recent falls, or injury to affected areas. She denies diaphoresis, SOB, nausea, vomiting, or diarrhea. She denies hx of significant medical  problems, including DVT in legs or lungs. She reports that her father has hx of heart disease at age 45.   Past Medical History  Diagnosis Date  . STD (sexually transmitted disease)     trichomonas   Past Surgical History  Procedure Laterality Date  . Breast surgery  1997    breast reduction  . Laparoscopic gastric banding  2010    done by [facility]   Family History  Problem Relation Age of Onset  . Hypertension Father   . Diabetes Maternal Grandmother    Social History  Substance Use Topics  . Smoking status: Current Every Day Smoker -- 0.50 packs/day for 30 years  . Smokeless tobacco: None  . Alcohol Use: 0.0 oz/week    0 Standard drinks or equivalent per week     Comment: social   OB History    Gravida Para Term Preterm AB TAB SAB Ectopic Multiple Living   1    1  1         Review of Systems  Constitutional: Negative for fever and diaphoresis.  HENT: Negative for sore throat.   Eyes: Negative for visual disturbance.  Respiratory: Negative for cough and shortness of breath.   Cardiovascular: Positive for chest pain.  Gastrointestinal: Negative for nausea, vomiting, abdominal pain and diarrhea.  Genitourinary: Negative for difficulty urinating.  Musculoskeletal: Positive for myalgias, arthralgias and neck pain. Negative for back pain.  Skin: Negative for rash.  Neurological: Negative for syncope and headaches.   Allergies  Review of patient's  allergies indicates no known allergies.  Home Medications   Prior to Admission medications   Medication Sig Start Date End Date Taking? Authorizing Provider  acetaminophen (TYLENOL) 500 MG tablet Take 500 mg by mouth every 6 (six) hours as needed for mild pain.   Yes Historical Provider, MD  trolamine salicylate (ASPERCREME) 10 % cream Apply 1 application topically as needed for muscle pain.   Yes Historical Provider, MD  amoxicillin (AMOXIL) 875 MG tablet Take 1 tablet (875 mg total) by mouth 2 (two) times  daily. Patient not taking: Reported on 06/05/2015 11/19/14   Wallis Bamberg, PA-C  chlorpheniramine (CHLORPHEN) 4 MG tablet Take 1 tablet (4 mg total) by mouth 2 (two) times daily as needed for allergies. Patient not taking: Reported on 06/05/2015 11/15/14   Sherren Mocha, MD  Guaifenes[facility] MAXIMUM STRENGTH) 1200 MG TB12 Take 1 tablet (1,200 mg total) by mouth every 12 (twelve) hours as needed. Patient not taking: Reported on 06/05/2015 11/15/14   Sherren Mocha, MD  HYDROcodone-acetaminophen (NORCO/VICODIN) 5-325 MG per tablet Take 1-2 tablets by mouth every 6 (six) hours as needed for moderate pain or severe pain. Patient not taking: Reported on 06/05/2015 08/05/14   Dahlia Client Muthersbaugh, PA-C  ipratropium (ATROVENT) 0.03 % nasal spray Place 2 sprays into the nose 4 (four) times daily. Patient not taking: Reported on 06/05/2015 02/22/15   Sherren Mocha, MD   BP 154/97 mmHg  Pulse 85  Temp(Src) 98.2 F (36.8 C) (Oral)  Resp 15  SpO2 100%  LMP 05/28/2015 (Approximate) Physical Exam  Constitutional: She is oriented to person, place, and time. She appears well-developed and well-nourished. No distress.  HENT:  Head: Normocephalic and atraumatic.  Eyes: Conjunctivae and EOM are normal.  Neck: Normal range of motion.  Cardiovascular: Normal rate, regular rhythm, normal heart sounds and intact distal pulses.  Exam reveals no gallop and no friction rub.   No murmur heard. Pulmonary/Chest: Effort normal and breath sounds normal. No respiratory distress. She has no wheezes. She has no rales. She exhibits tenderness.  Clear to auscultation bilaterally  Abdominal: Soft. She exhibits no distension. There is no tenderness. There is no guarding.  Musculoskeletal: She exhibits tenderness (significant left shoulder, arm, left side of neck). She exhibits no edema.  Neurological: She is alert and oriented to person, place, and time.  Skin: Skin is warm and dry. No rash noted. She is not diaphoretic. No erythema.   Nursing note and vitals reviewed.   ED Course  Procedures (including critical care time) DIAGNOSTIC STUDIES: Oxygen Saturation is 100% on RA, normal by my interpretation.    COORDINATION OF CARE: 1:03 AM-Discussed treatment plan which includes labs, x-ray, and Toradol with pt at bedside and pt agreed to plan.    Labs Review Labs Reviewed  CBC - Abnormal; Notable for the following:    RBC 6.30 (*)    MCV 68.4 (*)    MCH 21.3 (*)    RDW 17.3 (*)    All other components within normal limits  BASIC METABOLIC PANEL    Imaging Review Dg Chest 2 View  06/04/2015  CLINICAL DATA:  Left-sided chest pain for 1 day EXAM: CHEST - 2 VIEW COMPARISON:  07/13/2007 FINDINGS: Cardiac shadow is within normal limits. The lungs are well aerated bilaterally. No focal infiltrate or sizable effusion is seen. A gastric band is noted in the upper abdomen. No bony abnormality is seen. IMPRESSION: No active disease. Electronically Signed   By: Eulah Pont.D.  On: 06/04/2015 18:48   I have personally reviewed and evaluated these images and lab results as part of my medical decision-making.   EKG Interpretation   Date/Time:  Tuesday June 04 2015 18:40:29 EST Ventricular Rate:  76 PR Interval:  150 QRS Duration: 78 QT Interval:  385 QTC Calculation: 433 R Axis:   75 Text Interpretation:  Sinus rhythm Probable left atrial enlargement Low  voltage, precordial leads No significant change since last tracing  Confirmed by [facility] MD, Abb Gobert ([id number]) on 06/05/2015 12:23:06 AM      MDM   Final diagnoses:  None    44yo female with no significant medical history presents with concern for left sided chest and arm pain.  Differential diagnosis for chest pain includes pulmonary embolus, dissection, pneumothorax, pneumonia, ACS, myocarditis, pericarditis.  EKG was done and evaluate by me and showed no acute ST changes and no signs of pericarditis. Chest x-ray was done and evaluated by me and  radiology and showed no sign of pneumonia or pneumothorax. Patient is PERC negative and low risk [place] and have low suspicion for PE.  Patient is low risk HEART score and had negative troponin and given duration of constant symptoms have low suspicion for ACS.   Patient most likely with musculoskeletal chest pain and arm pain given significant pain with palpation as well as movements. Given Toradol  for pain with improvement in the ED.  Patient discharged in stable condition with understanding of reasons to return and recommend PCP follow up and given rx for naproxen and flexeril.   I personally performed the services described in this documentation, which was scribed in my presence. The recorded information has been reviewed and is accurate.   Alvira Monday, MD 06/05/15 2043

## 2015-06-05 NOTE — Discharge Instructions (Signed)
°Emergency Department Resource Guide °1) Find a Doctor and Pay Out of Pocket °Although you won't have to find out who is covered by your insurance plan, it is a good idea to ask around and get recommendations. You will then need to call the office and see if the doctor you have chosen will accept you as a new patient and what types of options they offer for patients who are self-pay. Some doctors offer discounts or will set up payment plans for their patients who do not have insurance, but you will need to ask so you aren't surprised when you get to your appointment. ° °2) Contact Your Local Health Department °Not all health departments have doctors that can see patients for sick visits, but many do, so it is worth a call to see if yours does. If you don't know where your local health department is, you can check in your phone book. The CDC also has a tool to help you locate your state's health department, and many state websites also have listings of all of their local health departments. ° °3) Find a Walk-in Clinic °If your illness is not likely to be very severe or complicated, you may want to try a walk in clinic. These are popping up all over the country in pharmacies, drugstores, and shopping centers. They're usually staffed by nurse practitioners or physician assistants that have been trained to treat common illnesses and complaints. They're usually fairly quick and inexpensive. However, if you have serious medical issues or chronic medical problems, these are probably not your best option. ° °No Primary Care Doctor: °- Call [facility] at  832-8000 - they can help you locate a primary care doctor that  accepts your insurance, provides certain services, etc. °- Physician Referral Service- 1-800-533-3463 ° °Chronic Pain Problems: °Organization         Address  Phone   Notes  °[facility]  (336) 297-2271 Patients need to be referred by their primary care doctor.  ° °Medication  Assistance: °Organization         Address  Phone   Notes  °[place] [facility] [address] °[place], [place] [address] (336) 641-8030 --Must be a resident of [place] °-- Must have NO insurance coverage whatsoever (no Medicaid/ Medicare, etc.) °-- The pt. MUST have a primary care doctor that directs their care regularly and follows them in the community °  °[facility]  (866) 331-1348   °[employer]  (888) 892-1162   ° °Agencies that provide inexpensive medical care: °Organization         Address  Phone   Notes  °[facility]  (336) 832-8035   °[facility]    (336) 832-7272   °[facility] [address] °[place], [place] [address] (336) 832-4777   °[facility] [address], °[place] (336) 271-4999   °[facility]    (336) 373-0678   °[facility]    (336) 272-1050   °[facility] ° [address], [place] Phone:  (336) 832-4444, Fax:  (336) 832-4440 Hours of Operation:  9 am - 6 pm, M-F.  Also accepts Medicaid/Medicare and self-pay.  °[facility] ° [address], [place] Phone: (336) 832-3150, Fax: (336) 832-3151. Hours of Operation:  8:30 am - 5:30 pm, M-F.  Also accepts Medicaid and self-pay.  °[facility] [place] [address], [place] Phone: (336) 878-6027   °[facility] [address], [place], [place] (336)723-1848, Ext. 123 Mondays & Thursdays: 7-9 AM.  First 15 patients are seen on a first come, first serve basis. °  ° °Medicaid-accepting [place] Providers: ° °Organization         Address  Phone   Notes  °[facility] [address], [place] (336) 641-2100 Also accepts self-pay patients.  °[facility] [address], [place] ° (336) 856-9996   °[facility] [address], [place]  (336) 288-8857   °[facility] [address] [place] [address], [place] (336) 299-7000   °Veita Bland [address], [place]  ° (336) 373-1557 Only accepts [place] Access Medicaid patients after they have their name applied to their card.  ° °Self-Pay (no insurance) in [place]: ° °Organization         Address  Phone   Notes  °Sickle Cell Patients, [facility] [address], [place] (336) 832-1970   °[facility] [address], [place] (336) 832-4400   °[facility] ° [address], [place] (336) 992-4800   °[facility]/Dr. Osei-Bonsu ° [address] [place] [address], [place] or [address], [place] (336) [phone] Phone number for both [place] and [place] locations is the same.  °[facility] [address], [place] (336) 299-0000   °[facility] [address] [place] [address], [place] or [address] (336) 852-7530 °(336) 878-2260   °[facility] [address], [place] (336) 350-1642, phone; (336) 294-5005, fax Sees patients 1st and 3rd Saturday of every month.  Must not qualify for public or private insurance (i.e. Medicaid, Medicare, [place] Health Choice, Veterans' Benefits) • Household income should be no more than 200% of the poverty level •The clinic cannot treat you if you are pregnant or think you are pregnant • Sexually transmitted diseases are not treated at the clinic.  ° ° °Dental Care: °Organization         Address  Phone  Notes  °[place] [facility] [address], [place] (336) 641-6152 Accepts children up to age 21 who are enrolled in Medicaid or [place] Health Choice; pregnant women with a Medicaid card; and children who have applied for Medicaid or [place] Health Choice, but were declined, whose parents can pay a reduced fee at time of service.  °[place]  [facility] [place]  [address], [place] (336) 641-7733 Accepts children up to age 21 who are enrolled in Medicaid or [place] Health Choice; pregnant women with a Medicaid card; and children who have applied for Medicaid or [place] Health Choice, but were declined, whose parents can pay a reduced fee at time of service.  °[facility] ° [address], [place] (336) 641-4533 Patients are seen by appointment only. Walk-ins are not accepted. [facility] will see patients 18 years of age and older. °Monday - Tuesday (8am-5pm) °Most Wednesdays (8:30-5pm) °$30 per visit, cash only  °[facility] ° [address], [place] (336) 641-4533 Patients are seen by appointment only. Walk-ins are not accepted. [facility] will see patients 18 years of age and older. °One   Wednesday Evening (Monthly: Volunteer Based).  $30 per visit, cash only  °[facility]  (919) 537-3737 for adults; Children under age 4, call Graduate Pediatric Dentistry at ([phone]) [phone]. Children aged 4-14, please call (919) [phone] to request a pediatric application. ° Dental services are provided in all areas of dental care including fillings, crowns and bridges, complete and partial dentures, implants, gum treatment, root canals, and extractions. Preventive care is also provided. Treatment is provided to both adults and children. °Patients are selected via a lottery and there is often a waiting list. °  °[facility] [address], °[place] ° (336) 763-8833 [email or web address] °  °[facility] [address], [place], [place] (336)723-1848, Ext. 123 Second and Fourth Thursday of each month, opens at 6:30 AM; Clinic ends at 9 AM.  Patients are seen on a first-come first-served basis, and a limited number are seen during each clinic.  ° °[facility] ° [address], [place], [place] (336) 723-7904    Eligibility Requirements °You must have lived in [place], or [place] counties for at least the last three months. °  You cannot be eligible for state or federal sponsored healthcare insurance, including Veterans Administration, Medicaid, or Medicare. °  You generally cannot be eligible for healthcare insurance through your employer.  °  How to apply: °Eligibility screenings are held every Tuesday and Wednesday afternoon from 1:00 pm until 4:00 pm. You do not need an appointment for the interview!  °[facility] [address], [place], [place] 336-631-2330   °[facility]  336-342-8273   °[facility]  336-703-3100   °[facility]  336-570-6415   ° °Behavioral Health Resources in the Community: °Intensive Outpatient Programs °Organization         Address  Phone  Notes  °[facility] [address], [place], [place] 336-878-6098   °[facility] [address], [place], [place] 336-832-9800   °[facility] [address], [place], [place] ° 336-882-2125   °[facility] [address],  °[place], [place] 1-800-853-5163 or 336-641-4981   °Substance Abuse Resources °Organization         Address  Phone  Notes  °[facility]  336-882-2125   °[facility]  336-784-9470   °[facility]  336-285-9073   °[facility]  336-845-3988   °Residential & Outpatient Substance Abuse Program  1-800-659-3381   °Psychological Services °Organization         Address  Phone  Notes  °[facility]  336- 832-9600   °[facility]  336- 378-7881   °[facility] [address], [place] 1-800-853-5163 or 336-641-4981   ° °Mobile Crisis Teams °Organization         Address  Phone  Notes  °[facility], Mobile Crisis Care Unit  1-877-626-1772   °Assertive °[facility] ° [address]  [place], [place] 336-834-9664   °Sharon DeEsch [address] °[place] 336-554-5454   ° °Self-Help/Support Groups °Organization         Address  Phone             Notes  °[facility]  - variety of support groups  336- 373-1402 Call for more information  °Narcotics Anonymous (NA), [facility] [address], °[place]  2 meetings at this location  ° °  Residential Treatment Programs Organization         Address  Phone  Notes  [facility] [address]    [place]  4-540-981-1914   [facility]  [address] [place] [address], [place], [place] 213-086-5784   [facility] [address] [place], [place] 696-295-2841 Admissions: 8am-3pm M-F  [facility] [address]    [place], [place] 324-401-0272   [facility] [address] [place], [place], [place] 536-644-0347   [facility] [address]  [place], [place] [phone]   [facility] Intensive Outpatient [address], [place], [place] 643-329-5188   [facility] [address] [place]  [place], [place] 4-166-063-0160 or 7547237606   [facility] [address], [place], [place] 220-254-2706 Accepts Medicaid  [facility] [address],  [place] 2-376-283-1517 Substance Abuse/Addiction Treatment   [facility]         Address  Phone  Notes  [facility]  601-626-5606   Angie Fava, PhD [address] [place], [place]   573-239-6142 or 302 606 7817   [facility]   [address] [place], [place] 940-879-0911   [facility] [address], [place], [place] 713-735-3069 Insurance/Medicaid/sponsorship through [facility] [address]                                    [place], [place] (380) [phone] Therapy/tele-psych/case    [facility] [address], [place] (862) [phone]    Dr. Lolly Mustache  [phone]   [facility]  [facility] Dept. 1) [address], [place] 2) [address] [place] 3)  [address], [place] (702) [phone] (908) [phone]  [phone]   [facility] Child Abuse Hotline 458-499-8793 or 6027427452 (After Hours)       Chest Wall Pain Chest wall pain is pain in or around the bones and muscles of your chest. Sometimes, an injury causes this pain. Sometimes, the cause may not be known. This pain may take several weeks or longer to get better. HOME CARE INSTRUCTIONS  Pay attention to any changes in your symptoms. Take these actions to help with your pain:   Rest as told by your health care provider.   Avoid activities that cause pain. These include any activities that use your chest muscles or your abdominal and side muscles to lift heavy items.   If directed, apply ice to the painful area:  Put ice in a plastic bag.  Place a towel between your skin and the bag.  Leave the ice on for 20 minutes, 2-3 times per day.  Take over-the-counter and prescription medicines only as told by your health care provider.  Do not use tobacco products, including cigarettes, chewing tobacco, and e-cigarettes. If you need help quitting, ask your health care provider.  Keep all follow-up visits as told by your health care provider. This is important. SEEK MEDICAL CARE IF:  You have a fever.  Your chest pain becomes worse.  You have new symptoms. SEEK IMMEDIATE MEDICAL CARE IF:  You have nausea or vomiting.  You feel sweaty or light-headed.  You have a cough with  phlegm (sputum) or you cough up blood.  You develop shortness of breath.   This information is not intended to replace advice given to you by your health care provider. Make sure you discuss any questions you have with your health care provider.    Document Released: 05/25/2005 Document Revised: 02/13/2015 Document Reviewed: 08/20/2014 Elsevier Interactive Patient Education [employer]2016 Elsevier Inc.

## 2015-09-24 DIAGNOSIS — K259 Gastric ulcer, unspecified as acute or chronic, without hemorrhage or perforation: Secondary | ICD-10-CM | POA: Insufficient documentation

## 2015-11-12 ENCOUNTER — Encounter: Payer: Self-pay | Admitting: Internal Medicine

## 2015-11-12 ENCOUNTER — Ambulatory Visit (INDEPENDENT_AMBULATORY_CARE_PROVIDER_SITE_OTHER): Payer: BLUE CROSS/BLUE SHIELD | Admitting: Internal Medicine

## 2015-11-12 DIAGNOSIS — M5432 Sciatica, left side: Secondary | ICD-10-CM | POA: Diagnosis not present

## 2015-11-12 DIAGNOSIS — J3089 Other allergic rhinitis: Secondary | ICD-10-CM

## 2015-11-12 DIAGNOSIS — Z720 Tobacco use: Secondary | ICD-10-CM | POA: Insufficient documentation

## 2015-11-12 DIAGNOSIS — F17200 Nicotine dependence, unspecified, uncomplicated: Secondary | ICD-10-CM

## 2015-11-12 DIAGNOSIS — Z9884 Bariatric surgery status: Secondary | ICD-10-CM | POA: Diagnosis not present

## 2015-11-12 DIAGNOSIS — F17210 Nicotine dependence, cigarettes, uncomplicated: Secondary | ICD-10-CM | POA: Insufficient documentation

## 2015-11-12 DIAGNOSIS — J309 Allergic rhinitis, unspecified: Secondary | ICD-10-CM | POA: Insufficient documentation

## 2015-11-12 MED ORDER — CHLORPHENIRAMINE MALEATE 4 MG PO TABS
4.0000 mg | ORAL_TABLET | Freq: Two times a day (BID) | ORAL | Status: DC | PRN
Start: 2015-11-12 — End: 2017-06-11

## 2015-11-12 MED ORDER — HYDROCODONE-ACETAMINOPHEN 5-325 MG PO TABS: 1.0000 | ORAL_TABLET | Freq: Four times a day (QID) | ORAL | Status: DC | PRN

## 2015-11-12 MED ORDER — IBUPROFEN 800 MG PO TABS: 800.0000 mg | ORAL_TABLET | Freq: Three times a day (TID) | ORAL | Status: DC | PRN

## 2015-11-12 NOTE — Assessment & Plan Note (Addendum)
Has left leg pain daily, no back pain S/p injections by [facility] Taking hydrocodone as needed for pain I will refill - depending on how much she is taking she will need to see pain management Pain may be improved with weight loss Advised to have [facility] notes sent to me

## 2015-11-12 NOTE — Progress Notes (Signed)
Subjective:    Patient ID: Michaela Carr, female    DOB: [birth year], 45 y.o.   MRN: [id number]  HPI She is here to establish with a new pcp.    She has adopted her nephew and is engaged.  She has also been helping care for her father who is very sick and just went into rehab.    She had the lap band done in 2010.  In 2013 and 2014, she was not able to keep up with the fills due to being laid off.  She has been trying to exercise.  She has gone back to her gastric doctor.  He did not feel the lap band was working and feels the port needs to come out.  She needs to establish with a gastric doctor here in [place]. She wants to have the lap band removed and have a gastric sleeve.  She thinks this in addition to lifestyle changes will help her lose the weight.   She has sciatica in the left leg.  She has been following at [facility]. She has pain around her left knee - above and below it.   She had an epidural injection last year and again this year.  She takes the pain medication when she has pain - her last pill she took three weeks ago.  She takes the naproxen and flexeril, but is out of them and has not taken them in a while.  She denies back pain.  She has taken flexeril and naproxen in the past but has not taken them in a while.  She has bad menstrual cramps and takes advil 800 mg as needed, which helps.     Medications and allergies reviewed with patient and updated if appropriate.  Patient Active Problem List   Diagnosis Date Noted  . Sciatica of left side without back pain 11/12/2015  . Tobacco use disorder 11/12/2015  . Allergic rhinitis 11/12/2015  . Bariatric surgery status 10/07/2009  . OBESITY, MORBID 02/23/2007    Current Outpatient Prescriptions on File Prior to Visit  Medication Sig Dispense Refill  . acetaminophen (TYLENOL) 500 MG tablet Take 500 mg by mouth every 6 (six) hours as needed for mild pain.    . cyclobenzaprine (FLEXERIL) 10 MG tablet Take 1  tablet (10 mg total) by mouth 2 (two) times daily as needed for muscle spasms. 20 tablet 0  . ipratropium (ATROVENT) 0.03 % nasal spray Place 2 sprays into the nose 4 (four) times daily. 90 mL 2  . naproxen (NAPROSYN) 500 MG tablet Take 1 tablet (500 mg total) by mouth 2 (two) times daily. 30 tablet 0   No current facility-administered medications on file prior to visit.    Past Medical History  Diagnosis Date  . STD (sexually transmitted disease)     trichomonas    Past Surgical History  Procedure Laterality Date  . Breast surgery  1997    breast reduction  . Laparoscopic gastric banding  2010    done by [facility]    Social History   Social History  . Marital Status: Divorced    Spouse Name: N/A  . Number of Children: N/A  . Years of Education: N/A   Social History Main Topics  . Smoking status: Current Every Day Smoker -- 0.50 packs/day for 30 years  . Smokeless tobacco: None  . Alcohol Use: 0.0 oz/week    0 Standard drinks or equivalent per week     Comment: social  .  Drug Use: No  . Sexual Activity: Not Currently    Birth Control/ Protection: Condom   Other Topics Concern  . None   Social History Narrative    Family History  Problem Relation Age of Onset  . Hypertension Father   . Diabetes Maternal Grandmother     Review of Systems  Constitutional: Negative for fever, chills, appetite change and fatigue.  Eyes: Negative for visual disturbance.  Respiratory: Negative for cough, shortness of breath and wheezing.   Cardiovascular: Negative for chest pain, palpitations and leg swelling.  Gastrointestinal: Negative for nausea, abdominal pain, diarrhea, constipation and blood in stool (no black stool).       No gerd  Genitourinary: Negative for dysuria and hematuria.  Musculoskeletal: Negative for back pain.       Left leg pain  Neurological: Negative for light-headedness and headaches.  Psychiatric/Behavioral: Negative for dysphoric mood. The  patient is not nervous/anxious.        Objective:   Filed Vitals:   11/12/15 1526  BP: 124/80  Pulse: 87  Temp: 98.7 F (37.1 C)  Resp: 16   Filed Weights   11/12/15 1526  Weight: 302 lb (136.986 kg)   Body mass index is 45.93 kg/(m^2).   Physical Exam Constitutional: She appears well-developed and well-nourished. No distress.  HENT:  Head: Normocephalic and atraumatic.  Right Ear: External ear normal. Normal ear canal and TM Left Ear: External ear normal.  Normal ear canal and TM Mouth/Throat: Oropharynx is clear and moist.  Eyes: Conjunctivae normal.  Neck: Neck supple. No tracheal deviation present. No thyromegaly present.  No carotid bruit  Cardiovascular: Normal rate, regular rhythm and normal heart sounds.   No murmur heard.  No edema. Pulmonary/Chest: Effort normal and breath sounds normal. No respiratory distress. She has no wheezes. She has no rales.  Abdominal: Soft. She exhibits no distension. There is no tenderness.  Lymphadenopathy: She has no cervical adenopathy.  Skin: Skin is warm and dry. She is not diaphoretic.  Psychiatric: She has a normal mood and affect. Her behavior is normal.         Assessment & Plan:   See Problem List for Assessment and Plan of chronic medical problems.

## 2015-11-12 NOTE — Progress Notes (Signed)
Pre visit review using our clinic review tool, if applicable. No additional management support is needed unless otherwise documented below in the visit note. 

## 2015-11-12 NOTE — Assessment & Plan Note (Signed)
Taking chlorpheniramine as needed

## 2015-11-12 NOTE — Assessment & Plan Note (Signed)
Referred to bariatric surgery -- will have lap bad removed and would like to have gastric sleeve Stressed regular exercise and healthy diet

## 2015-11-12 NOTE — Patient Instructions (Addendum)
   Medications reviewed and updated.  No changes recommended at this time.  Your prescription(s) have been submitted to your pharmacy. Please take as directed and contact our office if you believe you are having problem(s) with the medication(s).  A referral was ordered for surgery.

## 2015-11-12 NOTE — Assessment & Plan Note (Signed)
Stressed smoking cessation 

## 2015-11-12 NOTE — Assessment & Plan Note (Signed)
Referred to bariatric surgery Wants to have lap band removed Wants to have gastric sleeve

## 2016-03-08 NOTE — Progress Notes (Signed)
Subjective:    Patient ID: Michaela Carr, female    DOB: 07/23/1970, 45 y.o.   MRN: [id number]  HPI The patient is here for follow up.  Toe tingling:  Her right first toe has been tingling intermittently for the past several months.  She denies current tingling now.  She is concerned about diabetes.  She was also drinking a lot and going to the bathroom a lot.    Fatigue:  She is also tired and has no energy.  She is not sure if that is related to working too much and her weight.   She has some urgency with urination.  She has pain in her left leg that is relieved sometimes with urinating.  She does have chronic sciatica in her left leg.   She has started to eat healthy.  She started back at the gym today.  She has an appointment this Thursday with bariatric surgery to discuss getting the lap band removed and consider the gastric sleeve.    Medications and allergies reviewed with patient and updated if appropriate.  Patient Active Problem List   Diagnosis Date Noted  . Tingling 03/09/2016  . Sciatica of left side without back pain 11/12/2015  . Tobacco use disorder 11/12/2015  . Allergic rhinitis 11/12/2015  . Bariatric surgery status 10/07/2009  . OBESITY, MORBID 02/23/2007    Current Outpatient Prescriptions on File Prior to Visit  Medication Sig Dispense Refill  . acetaminophen (TYLENOL) 500 MG tablet Take 500 mg by mouth every 6 (six) hours as needed for mild pain.    . chlorpheniramine (CHLORPHEN) 4 MG tablet Take 1 tablet (4 mg total) by mouth 2 (two) times daily as needed for allergies. 60 tablet 5  . cyclobenzaprine (FLEXERIL) 10 MG tablet Take 1 tablet (10 mg total) by mouth 2 (two) times daily as needed for muscle spasms. 20 tablet 0  . HYDROcodone-acetaminophen (NORCO/VICODIN) 5-325 MG tablet Take 1-2 tablets by mouth every 6 (six) hours as needed for moderate pain or severe pain. 30 tablet 0  . ibuprofen (ADVIL,MOTRIN) 800 MG tablet Take 1 tablet (800 mg total) by  mouth every 8 (eight) hours as needed. 30 tablet 0  . ipratropium (ATROVENT) 0.03 % nasal spray Place 2 sprays into the nose 4 (four) times daily. 90 mL 2  . naproxen (NAPROSYN) 500 MG tablet Take 1 tablet (500 mg total) by mouth 2 (two) times daily. 30 tablet 0   No current facility-administered medications on file prior to visit.     Past Medical History:  Diagnosis Date  . STD (sexually transmitted disease)    trichomonas    Past Surgical History:  Procedure Laterality Date  . BREAST SURGERY  1997   breast reduction  . LAPAROSCOPIC GASTRIC BANDING  2010   done by [facility]    Social History   Social History  . Marital status: Divorced    Spouse name: N/A  . Number of children: N/A  . Years of education: N/A   Social History Main Topics  . Smoking status: Current Every Day Smoker    Packs/day: 0.50    Years: 30.00  . Smokeless tobacco: None  . Alcohol use 0.0 oz/week     Comment: social  . Drug use: No  . Sexual activity: Not Currently    Birth control/ protection: Condom   Other Topics Concern  . None   Social History Narrative  . None    Family History  Problem Relation  Age of Onset  . Hypertension Father   . Diabetes Maternal Grandmother     Review of Systems  Constitutional: Positive for fatigue. Negative for chills and fever.  Eyes: Negative for visual disturbance (needs reading glasses).  Respiratory: Negative for cough, shortness of breath and wheezing.   Cardiovascular: Negative for chest pain, palpitations and leg swelling.  Endocrine: Positive for polydipsia and polyuria.  Genitourinary: Positive for urgency. Negative for dysuria and hematuria.  Musculoskeletal:       Sciatica in left leg  Neurological: Positive for headaches (mild). Negative for light-headedness.       Objective:   Vitals:   03/09/16 1519  BP: 128/70  Pulse: 87  Resp: 16  Temp: 98.5 F (36.9 C)   Filed Weights   03/09/16 1519  Weight: (!) 305 lb  (138.3 kg)   Body mass index is 46.38 kg/m.   Physical Exam    Constitutional: Appears well-developed and well-nourished. No distress.  HENT:  Head: Normocephalic and atraumatic.  Neck: Neck supple. No tracheal deviation present. No thyromegaly present.  No cervical lymphadenopathy Cardiovascular: Normal rate, regular rhythm and normal heart sounds.   No murmur heard. No carotid bruit .  No edema Pulmonary/Chest: Effort normal and breath sounds normal. No respiratory distress. No has no wheezes. No rales.  Skin: Skin is warm and dry. Not diaphoretic.  Psychiatric: Normal mood and affect. Behavior is normal.      Assessment & Plan:    See Problem List for Assessment and Plan of chronic medical problems.

## 2016-03-09 ENCOUNTER — Other Ambulatory Visit (INDEPENDENT_AMBULATORY_CARE_PROVIDER_SITE_OTHER): Payer: BLUE CROSS/BLUE SHIELD

## 2016-03-09 ENCOUNTER — Ambulatory Visit (INDEPENDENT_AMBULATORY_CARE_PROVIDER_SITE_OTHER): Payer: BLUE CROSS/BLUE SHIELD | Admitting: Internal Medicine

## 2016-03-09 ENCOUNTER — Encounter: Payer: Self-pay | Admitting: Internal Medicine

## 2016-03-09 VITALS — BP 128/70 | HR 87 | Temp 98.5°F | Resp 16 | Wt 305.0 lb

## 2016-03-09 DIAGNOSIS — R3589 Other polyuria: Secondary | ICD-10-CM

## 2016-03-09 DIAGNOSIS — R358 Other polyuria: Secondary | ICD-10-CM

## 2016-03-09 DIAGNOSIS — Z23 Encounter for immunization: Secondary | ICD-10-CM | POA: Diagnosis not present

## 2016-03-09 DIAGNOSIS — R202 Paresthesia of skin: Secondary | ICD-10-CM | POA: Diagnosis not present

## 2016-03-09 DIAGNOSIS — R5383 Other fatigue: Secondary | ICD-10-CM

## 2016-03-09 LAB — COMPREHENSIVE METABOLIC PANEL
ALBUMIN: 3.5 g/dL (ref 3.5–5.2)
ALT: 12 U/L (ref 0–35)
AST: 15 U/L (ref 0–37)
Alkaline Phosphatase: 60 U/L (ref 39–117)
BUN: 12 mg/dL (ref 6–23)
CHLORIDE: 105 meq/L (ref 96–112)
CO2: 31 meq/L (ref 19–32)
CREATININE: 0.85 mg/dL (ref 0.40–1.20)
Calcium: 8.7 mg/dL (ref 8.4–10.5)
GFR: 92.36 mL/min (ref >60.00)
Glucose, Bld: 91 mg/dL (ref 70–99)
Potassium: 3.7 mEq/L (ref 3.5–5.1)
SODIUM: 142 meq/L (ref 135–145)
Total Bilirubin: 0.2 mg/dL (ref 0.2–1.2)
Total Protein: 7.8 g/dL (ref 6.0–8.3)

## 2016-03-09 LAB — VITAMIN B12: Vitamin B-12: 653 pg/mL (ref 211–911)

## 2016-03-09 LAB — HEMOGLOBIN A1C: Hgb A1c MFr Bld: 6 % (ref 4.6–6.5)

## 2016-03-09 LAB — TSH: TSH: 0.98 u[IU]/mL (ref 0.35–4.50)

## 2016-03-09 NOTE — Progress Notes (Signed)
Pre visit review using our clinic review tool, if applicable. No additional management support is needed unless otherwise documented below in the visit note. 

## 2016-03-09 NOTE — Assessment & Plan Note (Signed)
Check a1c 

## 2016-03-09 NOTE — Patient Instructions (Addendum)
  Test(s) ordered today. Your results will be released to MyChart (or called to you) after review, usually within 72hours after test completion. If any changes need to be made, you will be notified at that same time.  All other Health Maintenance issues reviewed.   All recommended immunizations and age-appropriate screenings are up-to-date or discussed.  tdap vaccine administered today.   Medications reviewed and updated.  No changes recommended at this time.    Please followup annually for a physical

## 2016-03-10 LAB — CBC WITH DIFFERENTIAL/PLATELET
BASOS PCT: 0 %
Basophils Absolute: 0 cells/uL (ref 0–200)
Eosinophils Absolute: 340 cells/uL (ref 15–500)
Eosinophils Relative: 4 %
HEMATOCRIT: 39.4 % (ref 35.0–45.0)
HEMOGLOBIN: 12.2 g/dL (ref 11.7–15.5)
LYMPHS ABS: 2805 {cells}/uL (ref 850–3900)
Lymphocytes Relative: 33 %
MCH: 21 pg — ABNORMAL LOW (ref 27.0–33.0)
MCHC: 31 g/dL — ABNORMAL LOW (ref 32.0–36.0)
MCV: 67.7 fL — ABNORMAL LOW (ref 80.0–100.0)
MONO ABS: 510 {cells}/uL (ref 200–950)
MPV: 9.5 fL (ref 7.5–12.5)
Monocytes Relative: 6 %
NEUTROS ABS: 4845 {cells}/uL (ref 1500–7800)
Neutrophils Relative %: 57 %
Platelets: 354 10*3/uL (ref 140–400)
RBC: 5.82 MIL/uL — AB (ref 3.80–5.10)
RDW: 18.6 % — AB (ref 11.0–15.0)
WBC: 8.5 10*3/uL (ref 3.8–10.8)

## 2016-03-12 ENCOUNTER — Encounter: Payer: Self-pay | Admitting: Internal Medicine

## 2016-03-12 DIAGNOSIS — R7303 Prediabetes: Secondary | ICD-10-CM | POA: Insufficient documentation

## 2016-03-13 ENCOUNTER — Other Ambulatory Visit (HOSPITAL_COMMUNITY): Payer: Self-pay | Admitting: Surgery

## 2016-03-13 ENCOUNTER — Encounter (HOSPITAL_COMMUNITY): Payer: Self-pay

## 2016-03-13 DIAGNOSIS — Z6841 Body Mass Index (BMI) 40.0 and over, adult: Principal | ICD-10-CM

## 2016-03-27 ENCOUNTER — Ambulatory Visit (HOSPITAL_COMMUNITY)
Admission: RE | Admit: 2016-03-27 | Discharge: 2016-03-27 | Disposition: A | Discharge status: Home / Self Care | Payer: BLUE CROSS/BLUE SHIELD | Source: Ambulatory Visit | Attending: Surgery | Admitting: Surgery

## 2016-03-27 DIAGNOSIS — K224 Dyskinesia of esophagus: Secondary | ICD-10-CM | POA: Diagnosis not present

## 2016-03-27 DIAGNOSIS — K219 Gastro-esophageal reflux disease without esophagitis: Secondary | ICD-10-CM | POA: Insufficient documentation

## 2016-03-27 DIAGNOSIS — Z6841 Body Mass Index (BMI) 40.0 and over, adult: Secondary | ICD-10-CM | POA: Diagnosis present

## 2016-05-04 ENCOUNTER — Encounter: Payer: BLUE CROSS/BLUE SHIELD | Attending: Surgery | Admitting: Skilled Nursing Facility1

## 2016-05-04 ENCOUNTER — Encounter: Payer: Self-pay | Admitting: Skilled Nursing Facility1

## 2016-05-04 DIAGNOSIS — E669 Obesity, unspecified: Secondary | ICD-10-CM | POA: Diagnosis present

## 2016-05-04 NOTE — Patient Instructions (Signed)
Follow Pre-Op Goals Try Protein Shakes Call [facility] at [phone] when surgery is scheduled to enroll in Pre-Op Class  Things to remember:  Please always be honest with us. We want to support you!  If you have any questions or concerns in between appointments, please call or email Liz, Leslie, or Laurie.  The diet after surgery will be high protein and low in carbohydrate.  Vitamins and calcium need to be taken for the rest of your life.  Feel free to include support people in any classes or appointments.   Supplement recommendations:  Before Surgery   1 Complete Multivitamin with Iron  3000 IU Vitamin D3  After Surgery   2 Chewable Multivitamins  **Best Choice - Bariatric Advantage Advanced Multi EA      3 Chewable Calcium (500 mg each, total 1200-1500 mg per day)  **Best Choice - Celebrate, Bariatric Advantage, or Wellesse  Other Options:    2 Flinstones Complete + up to 100 mg Thiamin + 2000-3000 IU Vitamin D3 + 350-500 mcg Vitamin B12 + 30-45 mg Iron (with history of deficiency)  2 Celebrate MultiComplete with 18 mg Iron (this provides 6000 IU of  Vitamin D3)  4 Celebrate Essential Multi 2 in 1 (has calcium) + 18-60 mg separate  iron  Vitamins and Calcium are available at:   [facility]   [address], [place], [place] [address]   [email or web address]  [email or web address]  [email or web address]   

## 2016-05-04 NOTE — Progress Notes (Signed)
  Pre-Op Assessment Visit:  Pre-Operative Sleeve Gastrectomy Surgery  Medical Nutrition Therapy:  Appt start time: 4:03  End time: 4:45  Patient was seen on 08/05/2015 for Pre-Operative Nutrition Assessment. Assessment and letter of approval [facility] coordinator on 08/05/2015.  Pt states she currently has the lap band which is currently filled. Pt states she lost her father 4 months ago.  Preferred Learning Style:  No preference indicated   Learning Readiness:   Change in progress  Handouts given during visit include:  Pre-Op Goals Bariatric Surgery Protein Shakes  During the appointment today the following Pre-Op Goals were reviewed with the patient: Maintain or lose weight as instructed by your surgeon Make healthy food choices Begin to limit portion sizes Limited concentrated sugars and fried foods Keep fat/sugar in the single digits per serving on food labels Practice CHEWING your food  (aim for 30 chews per bite or until applesauce consistency) Practice not drinking 15 minutes before, during, and 30 minutes after each meal/snack Avoid all carbonated beverages  Avoid/limit caffeinated beverages  Avoid all sugar-sweetened beverages Consume 3 meals per day; eat every 3-5 hours Make a list of non-food related activities Aim for 64-100 ounces of FLUID daily  Aim for at least 60-80 grams of PROTEIN daily Look for a liquid protein source that contain ?15 g protein and ?5 g carbohydrate  (ex: shakes, drinks, shots)  Patient-Centered Goals: 10/10 specific/non-scale and confidence/importance scale 1-10  Demonstrated degree of understanding via:  Teach Back  Teaching Method Utilized:  Visual Auditory Hands on  Barriers to learning/adherence to lifestyle change: none identified   [facility] to call the [facility] to enroll in Pre-Op and Post-Op Nutrition Education when surgery date is scheduled.

## 2016-05-07 ENCOUNTER — Telehealth: Payer: Self-pay | Admitting: Internal Medicine

## 2016-05-07 NOTE — Telephone Encounter (Signed)
Spoke with pt, she would like to start the weight loss process, she is only about to come in after 330 due to her work schedule. I have scheduled her an appt. Is this something that she needs  To be seen for? Please advise.

## 2016-05-07 NOTE — Telephone Encounter (Signed)
Patient states she needs Michaela Carr to give her a call based on a previous conversation she had with Michaela Carr a few weeks ago.

## 2016-05-07 NOTE — Telephone Encounter (Signed)
I think she just needs the letter for surgery  - if so we can just write the letter.  We may need to know the diet/weight loss attempts she has tried.

## 2016-05-13 NOTE — Telephone Encounter (Signed)
LVM for pt to call back and discuss.  

## 2016-05-20 ENCOUNTER — Telehealth: Payer: Self-pay | Admitting: Internal Medicine

## 2016-05-22 ENCOUNTER — Ambulatory Visit: Payer: BLUE CROSS/BLUE SHIELD | Admitting: Internal Medicine

## 2016-05-29 NOTE — Telephone Encounter (Signed)
Spoke with pt, she states she has tried dieting and exercise. She has previously had the lap band surgery and will now be having a sleeve revision. Her most recent weight on 05-26-16 was 306lbs.  Please write letter [facility].

## 2016-06-02 NOTE — Telephone Encounter (Signed)
Letter written

## 2016-06-03 NOTE — Telephone Encounter (Signed)
Letter has been faxed to [facility]

## 2016-07-20 ENCOUNTER — Encounter: Payer: BLUE CROSS/BLUE SHIELD | Attending: Surgery | Admitting: Dietician

## 2016-07-20 DIAGNOSIS — E669 Obesity, unspecified: Secondary | ICD-10-CM | POA: Diagnosis present

## 2016-07-22 ENCOUNTER — Encounter: Payer: Self-pay | Admitting: Dietician

## 2016-07-22 NOTE — Progress Notes (Signed)
  Pre-Operative Nutrition Class:  Appt start time: 5449   End time:  1830.  Patient was seen on 07/20/2016 for Pre-Operative Bariatric Surgery Education at the [facility].   Surgery date:  Surgery type: sleeve gastrectomy Start weight at [facility]: 310 lbs on 05/04/2016 Weight today: 310.2 lbs  TANITA  BODY COMP RESULTS  07/20/16   BMI (kg/m^2) 310.2   Fat Mass (lbs) 162.2   Fat Free Mass (lbs) 148   Total Body Water (lbs) 110.4   Samples given per MNT protocol. Patient educated on appropriate usage: Premier protein shake (vanilla - qty 1) Lot #: [id number] Exp: 05/2017  Bariatric Advantage Calcium Citrate chew (lemon - qty 1) Lot #: [id number] Exp: 11/2016  Renee Pain Protein Powder (unflavored - qty 1) Lot #: [id number] Exp: 11/2017  The following the learning objectives were met by the patient during this course:  Identify Pre-Op Dietary Goals and will begin 2 weeks pre-operatively  Identify appropriate sources of fluids and proteins   State protein recommendations and appropriate sources pre and post-operatively  Identify Post-Operative Dietary Goals and will follow for 2 weeks post-operatively  Identify appropriate multivitamin and calcium sources  Describe the need for physical activity post-operatively and will follow MD recommendations  State when to call healthcare provider regarding medication questions or post-operative complications  Handouts given during class include:  Pre-Op Bariatric Surgery Diet Handout  Protein Shake Handout  Post-Op Bariatric Surgery Nutrition Handout  [facility] Program Information Flyer  Support Group Information Flyer  [facility] Bariatric Supplements Price List  Follow-Up Plan: Patient will follow-up at [facility] 2 weeks post operatively for diet advancement per MD.

## 2016-08-03 ENCOUNTER — Ambulatory Visit: Payer: BLUE CROSS/BLUE SHIELD

## 2016-11-17 ENCOUNTER — Other Ambulatory Visit: Payer: Self-pay | Admitting: Physical Medicine and Rehabilitation

## 2016-11-17 DIAGNOSIS — G894 Chronic pain syndrome: Secondary | ICD-10-CM

## 2016-12-07 ENCOUNTER — Ambulatory Visit
Admission: RE | Admit: 2016-12-07 | Discharge: 2016-12-07 | Disposition: A | Discharge status: Home / Self Care | Payer: BLUE CROSS/BLUE SHIELD | Source: Ambulatory Visit | Attending: Physical Medicine and Rehabilitation | Admitting: Physical Medicine and Rehabilitation

## 2016-12-07 VITALS — BP 155/93 | HR 76

## 2016-12-07 DIAGNOSIS — M5432 Sciatica, left side: Secondary | ICD-10-CM

## 2016-12-07 DIAGNOSIS — G894 Chronic pain syndrome: Secondary | ICD-10-CM

## 2016-12-07 MED ORDER — METHYLPREDNISOLONE ACETATE 40 MG/ML INJ SUSP (RADIOLOG
120.0000 mg | Freq: Once | INTRAMUSCULAR | Status: AC
  Administered 2016-12-07: 120 mg via EPIDURAL

## 2016-12-07 MED ORDER — IOPAMIDOL (ISOVUE-M 200) INJECTION 41%
1.0000 mL | Freq: Once | INTRAMUSCULAR | Status: AC
  Administered 2016-12-07: 1 mL via EPIDURAL

## 2016-12-07 NOTE — Discharge Instructions (Signed)

## 2017-01-26 DIAGNOSIS — Z9884 Bariatric surgery status: Secondary | ICD-10-CM | POA: Insufficient documentation

## 2017-03-04 ENCOUNTER — Other Ambulatory Visit (INDEPENDENT_AMBULATORY_CARE_PROVIDER_SITE_OTHER): Payer: BLUE CROSS/BLUE SHIELD

## 2017-03-04 ENCOUNTER — Ambulatory Visit (INDEPENDENT_AMBULATORY_CARE_PROVIDER_SITE_OTHER): Payer: BLUE CROSS/BLUE SHIELD | Admitting: Internal Medicine

## 2017-03-04 ENCOUNTER — Encounter: Payer: Self-pay | Admitting: Internal Medicine

## 2017-03-04 VITALS — BP 122/78 | HR 93 | Temp 98.1°F | Ht 67.0 in | Wt 293.0 lb

## 2017-03-04 DIAGNOSIS — M79671 Pain in right foot: Secondary | ICD-10-CM | POA: Insufficient documentation

## 2017-03-04 DIAGNOSIS — R350 Frequency of micturition: Secondary | ICD-10-CM

## 2017-03-04 DIAGNOSIS — R7303 Prediabetes: Secondary | ICD-10-CM

## 2017-03-04 LAB — URINALYSIS, ROUTINE W REFLEX MICROSCOPIC
BILIRUBIN URINE: NEGATIVE
Hgb urine dipstick: NEGATIVE
KETONES UR: NEGATIVE
LEUKOCYTES UA: NEGATIVE
NITRITE: NEGATIVE
PH: 5.5 (ref 5.0–8.0)
Specific Gravity, Urine: 1.025 (ref 1.000–1.030)
TOTAL PROTEIN, URINE-UPE24: NEGATIVE
URINE GLUCOSE: NEGATIVE
UROBILINOGEN UA: 0.2 (ref 0.0–1.0)

## 2017-03-04 MED ORDER — NAPROXEN 500 MG PO TABS: 500.0000 mg | ORAL_TABLET | Freq: Two times a day (BID) | ORAL | 2 refills | Status: DC

## 2017-03-04 MED ORDER — NAPROXEN 500 MG PO TABS: 500.0000 mg | ORAL_TABLET | Freq: Two times a day (BID) | ORAL | 0 refills | Status: DC

## 2017-03-04 MED ORDER — SOLIFENACIN SUCCINATE 5 MG PO TABS
5.0000 mg | ORAL_TABLET | Freq: Every day | ORAL | 3 refills | Status: DC
Start: 2017-03-04 — End: 2017-06-11

## 2017-03-04 NOTE — Patient Instructions (Signed)
Your specimen will be sent to the lab  Please take all new medication as prescribed - the Vesicare (although if this is not covered, please ask the pharmacy to let us know a similar medication)  Please take all new medication as prescribed - the naproxen for pain  Please make an apppointment with Sports Medicine at the desk as you leave for the right foot pain  Please continue all other medications as before, and refills have been done if requested.  Please have the pharmacy call with any other refills you may need.  Please keep your appointments with your specialists as you may have planned

## 2017-03-04 NOTE — Progress Notes (Signed)
Subjective:    Patient ID: Michaela Carr, female    DOB: 1971/04/08, 46 y.o.   MRN: [id number]  HPI  Here with 2 months onset urinary frequency better and worse, intermittent random , assoc with marked urgency and near wetting accidents, but Denies urinary symptoms such as dysuria, flank pain, hematuria or n/v, fever, chills.  Drinks plenty of fluids but does not feel better with less fluids.  Also c/o 2-3 wks suddenly worsening right foot medial aspect pain, swelling, tender without trauma or fever.   Pt denies polydipsia, polyuria Past Medical History:  Diagnosis Date  . STD (sexually transmitted disease)    trichomonas   Past Surgical History:  Procedure Laterality Date  . BREAST SURGERY  1997   breast reduction  . LAPAROSCOPIC GASTRIC BANDING  2010   done by [facility]    reports that she has been smoking.  She has a 15.00 pack-year smoking history. She has never used smokeless tobacco. She reports that she drinks alcohol. She reports that she does not use drugs. family history includes Diabetes in her maternal grandmother; Hypertension in her father. No Known Allergies Current Outpatient Prescriptions on File Prior to Visit  Medication Sig Dispense Refill  . acetaminophen (TYLENOL) 500 MG tablet Take 500 mg by mouth every 6 (six) hours as needed for mild pain.    . chlorpheniramine (CHLORPHEN) 4 MG tablet Take 1 tablet (4 mg total) by mouth 2 (two) times daily as needed for allergies. 60 tablet 5  . cyclobenzaprine (FLEXERIL) 10 MG tablet Take 1 tablet (10 mg total) by mouth 2 (two) times daily as needed for muscle spasms. 20 tablet 0  . HYDROcodone-acetaminophen (NORCO/VICODIN) 5-325 MG tablet Take 1-2 tablets by mouth every 6 (six) hours as needed for moderate pain or severe pain. 30 tablet 0  . ibuprofen (ADVIL,MOTRIN) 800 MG tablet Take 1 tablet (800 mg total) by mouth every 8 (eight) hours as needed. 30 tablet 0  . ipratropium (ATROVENT) 0.03 % nasal spray Place 2  sprays into the nose 4 (four) times daily. 90 mL 2   No current facility-administered medications on file prior to visit.    Review of Systems  Constitutional: Negative for other unusual diaphoresis or sweats HENT: Negative for ear discharge or swelling Eyes: Negative for other worsening visual disturbances Respiratory: Negative for stridor or other swelling  Gastrointestinal: Negative for worsening distension or other blood Genitourinary: Negative for retention or other urinary change Musculoskeletal: Negative for other MSK pain or swelling Skin: Negative for color change or other new lesions Neurological: Negative for worsening tremors and other numbness  Psychiatric/Behavioral: Negative for worsening agitation or other fatigue All other system neg per pt    Objective:   Physical Exam BP 122/78   Pulse 93   Temp 98.1 F (36.7 C) (Oral)   Ht  (1.702 m)   Wt 293 lb (132.9 kg)   SpO2 98%   BMI 45.89 kg/m  VS noted,  Constitutional: Pt appears in NAD HENT: Head: NCAT.  Right Ear: External ear normal.  Left Ear: External ear normal.  Eyes: . Pupils are equal, round, and reactive to light. Conjunctivae and EOM are normal Nose: without d/c or deformity Neck: Neck supple. Gross normal ROM Cardiovascular: Normal rate and regular rhythm.   Pulmonary/Chest: Effort normal and breath sounds without rales or wheezing.  Abd:  Soft, NT, ND, + BS, no organomegaly, no flank tender Neurological: Pt is alert. At baseline orientation, motor grossly  intact Right medial foot with 1.5 cm area mod swelling, tender just distal to the medial malleolus, o/w neurovasc intact Skin: Skin is warm. No rashes, other new lesions, no LE edema Psychiatric: Pt behavior is normal without agitation  No other exam findings    Assessment & Plan:

## 2017-03-05 LAB — URINE CULTURE
MICRO NUMBER:: 81073203
SPECIMEN QUALITY: ADEQUATE

## 2017-03-06 NOTE — Assessment & Plan Note (Signed)
Exam suspected ganglion cyst, for nsaid prn, refer sport medicine (pt to make appt as leaves)

## 2017-03-06 NOTE — Assessment & Plan Note (Signed)
High suspicion for OAB, for urine studies but likely neg for infection, will tx with vesicare 5 qd, f/u with PCP or urology referral if not improved

## 2017-03-06 NOTE — Assessment & Plan Note (Signed)
stable overall by history and exam, recent data reviewed with pt, and pt to continue medical treatment as before,  to f/u any worsening symptoms or concerns Lab Results  Component Value Date   HGBA1C 6.0 03/09/2016  to cont wt control efforts

## 2017-03-29 ENCOUNTER — Ambulatory Visit: Payer: BLUE CROSS/BLUE SHIELD | Admitting: Family Medicine

## 2017-04-27 DIAGNOSIS — G4733 Obstructive sleep apnea (adult) (pediatric): Secondary | ICD-10-CM | POA: Insufficient documentation

## 2017-06-10 DIAGNOSIS — E785 Hyperlipidemia, unspecified: Secondary | ICD-10-CM | POA: Insufficient documentation

## 2017-06-11 ENCOUNTER — Encounter: Payer: Self-pay | Admitting: Family

## 2017-06-11 ENCOUNTER — Ambulatory Visit: Payer: BLUE CROSS/BLUE SHIELD | Admitting: Family

## 2017-06-11 VITALS — BP 128/82 | HR 90 | Temp 98.3°F | Ht 67.0 in | Wt 301.0 lb

## 2017-06-11 DIAGNOSIS — J019 Acute sinusitis, unspecified: Secondary | ICD-10-CM | POA: Diagnosis not present

## 2017-06-11 MED ORDER — FLUTICASONE PROPIONATE 50 MCG/ACT NA SUSP: 2.0000 | Freq: Every day | NASAL | 6 refills | Status: DC

## 2017-06-11 MED ORDER — CEFDINIR 300 MG PO CAPS: 300.0000 mg | ORAL_CAPSULE | Freq: Two times a day (BID) | ORAL | 0 refills | Status: DC

## 2017-06-11 NOTE — Progress Notes (Signed)
Michaela Carr is a 47 y.o. female with the following history as recorded in EpicCare:  Patient Active Problem List   Diagnosis Date Noted  . Urinary frequency 03/04/2017  . Right foot pain 03/04/2017  . Prediabetes 03/12/2016  . Tingling 03/09/2016  . Sciatica of left side without back pain 11/12/2015  . Tobacco use disorder 11/12/2015  . Allergic rhinitis 11/12/2015  . Bariatric surgery status 10/07/2009  . OBESITY, MORBID 02/23/2007    Current Outpatient Medications  Medication Sig Dispense Refill  . cyclobenzaprine (FLEXERIL) 10 MG tablet Take 1 tablet (10 mg total) by mouth 2 (two) times daily as needed for muscle spasms. 20 tablet 0  . HYDROcodone-acetaminophen (NORCO/VICODIN) 5-325 MG tablet Take 1-2 tablets by mouth every 6 (six) hours as needed for moderate pain or severe pain. 30 tablet 0  . ibuprofen (ADVIL,MOTRIN) 800 MG tablet Take 1 tablet (800 mg total) by mouth every 8 (eight) hours as needed. 30 tablet 0  . naproxen (NAPROSYN) 500 MG tablet Take 1 tablet (500 mg total) by mouth 2 (two) times daily. 60 tablet 2  . phentermine (ADIPEX-P) 37.5 MG tablet     . cefdinir (OMNICEF) 300 MG capsule Take 1 capsule (300 mg total) by mouth 2 (two) times daily. 20 capsule 0  . fluticasone (FLONASE) 50 MCG/ACT nasal spray Place 2 sprays into both nostrils daily. 16 g 6   No current facility-administered medications for this visit.     Allergies: Patient has no known allergies.  Past Medical History:  Diagnosis Date  . STD (sexually transmitted disease)    trichomonas    Past Surgical History:  Procedure Laterality Date  . BREAST SURGERY  1997   breast reduction  . LAPAROSCOPIC GASTRIC BANDING  2010   done by [facility]    Family History  Problem Relation Age of Onset  . Hypertension Father   . Diabetes Maternal Grandmother     Social History   Tobacco Use  . Smoking status: Former Smoker    Packs/day: 0.50    Years: 30.00    Pack years: 15.00    Last  attempt to quit: 05/08/2017    Years since quitting: 0.0  . Smokeless tobacco: Never Used  Substance Use Topics  . Alcohol use: Yes    Alcohol/week: 0.0 oz    Comment: social    Subjective:  Patient presents with concerns for sinus pain/ pressure x 1 week; + headache; + cough; notes that has recently quit smoking; currently taking Benadryl and Flonase; no chest pain or difficulty breathing.  Objective:  Vitals:   06/11/17 1128  BP: 128/82  Pulse: 90  Temp: 98.3 F (36.8 C)  TempSrc: Oral  SpO2: 99%  Weight: (!) 301 lb (136.5 kg)  Height: 5\' 7"  (1.702 m)    General: Well developed, well nourished, in no acute distress  Skin : Warm and dry.  Head: Normocephalic and atraumatic  Eyes: Sclera and conjunctiva clear; pupils round and reactive to light; extraocular movements intact  Ears: External normal; canals clear; tympanic membranes normal  Oropharynx: Pink, supple. No suspicious lesions  Neck: Supple without thyromegaly, adenopathy  Lungs: Respirations unlabored; clear to auscultation bilaterally without wheeze, rales, rhonchi  CVS exam: normal rate and regular rhythm.  Abdomen: Soft; nontender; nondistended; normoactive bowel sounds; no masses or hepatosplenomegaly  Musculoskeletal: No deformities; no active joint inflammation  Extremities: No edema, cyanosis, clubbing  Vessels: Symmetric bilaterally  Neurologic: Alert and oriented; speech intact; face symmetrical; moves all  extremities well; CNII-XII intact without focal deficit  Assessment:  1. Acute sinusitis, recurrence not specified, unspecified location     Plan:  Rx for Omnicef 300 mg bid x10 days; refill on Flonase; increase fluids, rest and follow-up worse, no better.   No Follow-up on file.  No orders of the defined types were placed in this encounter.   Requested Prescriptions   Signed Prescriptions Disp Refills  . cefdinir (OMNICEF) 300 MG capsule 20 capsule 0    Sig: Take 1 capsule (300 mg total) by mouth  2 (two) times daily.  . fluticasone (FLONASE) 50 MCG/ACT nasal spray 16 g 6    Sig: Place 2 sprays into both nostrils daily.

## 2017-07-06 ENCOUNTER — Encounter: Payer: BLUE CROSS/BLUE SHIELD | Admitting: Internal Medicine

## 2017-07-10 NOTE — Patient Instructions (Addendum)
Follow up with your GYN and schedule your mammogram.   Test(s) ordered today. Your results will be released to [place] (or called to you) after review, usually within 72hours after test completion. If any changes need to be made, you will be notified at that same time.  All other Health Maintenance issues reviewed.   All recommended immunizations and age-appropriate screenings are up-to-date or discussed.  No immunizations administered today.   Medications reviewed and updated.  No changes recommended at this time.    Please followup in one year   Health Maintenance, Female Adopting a healthy lifestyle and getting preventive care can go a long way to promote health and wellness. Talk with your health care provider about what schedule of regular examinations is right for you. This is a good chance for you to check in with your provider about disease prevention and staying healthy. In between checkups, there are plenty of things you can do on your own. Experts have done a lot of research about which lifestyle changes and preventive measures are most likely to keep you healthy. Ask your health care provider for more information. Weight and diet Eat a healthy diet  Be sure to include plenty of vegetables, fruits, low-fat dairy products, and lean protein.  Do not eat a lot of foods high in solid fats, added sugars, or salt.  Get regular exercise. This is one of the most important things you can do for your health. ? Most adults should exercise for at least 150 minutes each week. The exercise should increase your heart rate and make you sweat (moderate-intensity exercise). ? Most adults should also do strengthening exercises at least twice a week. This is in addition to the moderate-intensity exercise.  Maintain a healthy weight  Body mass index (BMI) is a measurement that can be used to identify possible weight problems. It estimates body fat based on height and weight. Your health care  provider can help determine your BMI and help you achieve or maintain a healthy weight.  For females 61 years of age and older: ? A BMI below 18.5 is considered underweight. ? A BMI of 18.5 to 24.9 is normal. ? A BMI of 25 to 29.9 is considered overweight. ? A BMI of 30 and above is considered obese.  Watch levels of cholesterol and blood lipids  You should start having your blood tested for lipids and cholesterol at 48 years of age, then have this test every 5 years.  You may need to have your cholesterol levels checked more often if: ? Your lipid or cholesterol levels are high. ? You are older than 48 years of age. ? You are at high risk for heart disease.  Cancer screening Lung Cancer  Lung cancer screening is recommended for adults 20-38 years old who are at high risk for lung cancer because of a history of smoking.  A yearly low-dose CT scan of the lungs is recommended for people who: ? Currently smoke. ? Have quit within the past 15 years. ? Have at least a 30-pack-year history of smoking. A pack year is smoking an average of one pack of cigarettes a day for 1 year.  Yearly screening should continue until it has been 15 years since you quit.  Yearly screening should stop if you develop a health problem that would prevent you from having lung cancer treatment.  Breast Cancer  Practice breast self-awareness. This means understanding how your breasts normally appear and feel.  It also means doing  regular breast self-exams. Let your health care provider know about any changes, no matter how small.  If you are in your 20s or 30s, you should have a clinical breast exam (CBE) by a health care provider every 1-3 years as part of a regular health exam.  If you are 77 or older, have a CBE every year. Also consider having a breast X-ray (mammogram) every year.  If you have a family history of breast cancer, talk to your health care provider about genetic screening.  If you are  at high risk for breast cancer, talk to your health care provider about having an MRI and a mammogram every year.  Breast cancer gene (BRCA) assessment is recommended for women who have family members with BRCA-related cancers. BRCA-related cancers include: ? Breast. ? Ovarian. ? Tubal. ? Peritoneal cancers.  Results of the assessment will determine the need for genetic counseling and BRCA1 and BRCA2 testing.  Cervical Cancer Your health care provider may recommend that you be screened regularly for cancer of the pelvic organs (ovaries, uterus, and vagina). This screening involves a pelvic examination, including checking for microscopic changes to the surface of your cervix (Pap test). You may be encouraged to have this screening done every 3 years, beginning at age 22.  For women ages 109-65, health care providers may recommend pelvic exams and Pap testing every 3 years, or they may recommend the Pap and pelvic exam, combined with testing for human papilloma virus (HPV), every 5 years. Some types of HPV increase your risk of cervical cancer. Testing for HPV may also be done on women of any age with unclear Pap test results.  Other health care providers may not recommend any screening for nonpregnant women who are considered low risk for pelvic cancer and who do not have symptoms. Ask your health care provider if a screening pelvic exam is right for you.  If you have had past treatment for cervical cancer or a condition that could lead to cancer, you need Pap tests and screening for cancer for at least 20 years after your treatment. If Pap tests have been discontinued, your risk factors (such as having a new sexual partner) need to be reassessed to determine if screening should resume. Some women have medical problems that increase the chance of getting cervical cancer. In these cases, your health care provider may recommend more frequent screening and Pap tests.  Colorectal Cancer  This type of  cancer can be detected and often prevented.  Routine colorectal cancer screening usually begins at 48 years of age and continues through 48 years of age.  Your health care provider may recommend screening at an earlier age if you have risk factors for colon cancer.  Your health care provider may also recommend using home test kits to check for hidden blood in the stool.  A small camera at the end of a tube can be used to examine your colon directly (sigmoidoscopy or colonoscopy). This is done to check for the earliest forms of colorectal cancer.  Routine screening usually begins at age 57.  Direct examination of the colon should be repeated every 5-10 years through 48 years of age. However, you may need to be screened more often if early forms of precancerous polyps or small growths are found.  Skin Cancer  Check your skin from head to toe regularly.  Tell your health care provider about any new moles or changes in moles, especially if there is a change in a mole's  shape or color.  Also tell your health care provider if you have a mole that is larger than the size of a pencil eraser.  Always use sunscreen. Apply sunscreen liberally and repeatedly throughout the day.  Protect yourself by wearing long sleeves, pants, a wide-brimmed hat, and sunglasses whenever you are outside.  Heart disease, diabetes, and high blood pressure  High blood pressure causes heart disease and increases the risk of stroke. High blood pressure is more likely to develop in: ? People who have blood pressure in the high end of the normal range (130-139/85-89 mm Hg). ? People who are overweight or obese. ? People who are African American.  If you are 22-25 years of age, have your blood pressure checked every 3-5 years. If you are 22 years of age or older, have your blood pressure checked every year. You should have your blood pressure measured twice-once when you are at a hospital or clinic, and once when you are  not at a hospital or clinic. Record the average of the two measurements. To check your blood pressure when you are not at a hospital or clinic, you can use: ? An automated blood pressure machine at a pharmacy. ? A home blood pressure monitor.  If you are between 43 years and 48 years old, ask your health care provider if you should take aspirin to prevent strokes.  Have regular diabetes screenings. This involves taking a blood sample to check your fasting blood sugar level. ? If you are at a normal weight and have a low risk for diabetes, have this test once every three years after 48 years of age. ? If you are overweight and have a high risk for diabetes, consider being tested at a younger age or more often. Preventing infection Hepatitis B  If you have a higher risk for hepatitis B, you should be screened for this virus. You are considered at high risk for hepatitis B if: ? You were born in a country where hepatitis B is common. Ask your health care provider which countries are considered high risk. ? Your parents were born in a high-risk country, and you have not been immunized against hepatitis B (hepatitis B vaccine). ? You have HIV or AIDS. ? You use needles to inject street drugs. ? You live with someone who has hepatitis B. ? You have had sex with someone who has hepatitis B. ? You get hemodialysis treatment. ? You take certain medicines for conditions, including cancer, organ transplantation, and autoimmune conditions.  Hepatitis C  Blood testing is recommended for: ? Everyone born from [birth year] through 1966. ? Anyone with known risk factors for hepatitis C.  Sexually transmitted infections (STIs)  You should be screened for sexually transmitted infections (STIs) including gonorrhea and chlamydia if: ? You are sexually active and are younger than 48 years of age. ? You are older than 48 years of age and your health care provider tells you that you are at risk for this type of  infection. ? Your sexual activity has changed since you were last screened and you are at an increased risk for chlamydia or gonorrhea. Ask your health care provider if you are at risk.  If you do not have HIV, but are at risk, it may be recommended that you take a prescription medicine daily to prevent HIV infection. This is called pre-exposure prophylaxis (PrEP). You are considered at risk if: ? You are sexually active and do not regularly use condoms or know  the HIV status of your partner(s). ? You take drugs by injection. ? You are sexually active with a partner who has HIV.  Talk with your health care provider about whether you are at high risk of being infected with HIV. If you choose to begin PrEP, you should first be tested for HIV. You should then be tested every 3 months for as long as you are taking PrEP. Pregnancy  If you are premenopausal and you may become pregnant, ask your health care provider about preconception counseling.  If you may become pregnant, take 400 to 800 micrograms (mcg) of folic acid every day.  If you want to prevent pregnancy, talk to your health care provider about birth control (contraception). Osteoporosis and menopause  Osteoporosis is a disease in which the bones lose minerals and strength with aging. This can result in serious bone fractures. Your risk for osteoporosis can be identified using a bone density scan.  If you are 73 years of age or older, or if you are at risk for osteoporosis and fractures, ask your health care provider if you should be screened.  Ask your health care provider whether you should take a calcium or vitamin D supplement to lower your risk for osteoporosis.  Menopause may have certain physical symptoms and risks.  Hormone replacement therapy may reduce some of these symptoms and risks. Talk to your health care provider about whether hormone replacement therapy is right for you. Follow these instructions at home:  Schedule  regular health, dental, and eye exams.  Stay current with your immunizations.  Do not use any tobacco products including cigarettes, chewing tobacco, or electronic cigarettes.  If you are pregnant, do not drink alcohol.  If you are breastfeeding, limit how much and how often you drink alcohol.  Limit alcohol intake to no more than 1 drink per day for nonpregnant women. One drink equals 12 ounces of beer, 5 ounces of wine, or 1 ounces of hard liquor.  Do not use street drugs.  Do not share needles.  Ask your health care provider for help if you need support or information about quitting drugs.  Tell your health care provider if you often feel depressed.  Tell your health care provider if you have ever been abused or do not feel safe at home. This information is not intended to replace advice given to you by your health care provider. Make sure you discuss any questions you have with your health care provider. Document Released: 12/08/2010 Document Revised: 10/31/2015 Document Reviewed: 02/26/2015 Elsevier Interactive Patient Education  [employer].

## 2017-07-10 NOTE — Progress Notes (Signed)
Subjective:    Patient ID: Michaela Carr, female    DOB: 03/10/1971, 47 y.o.   MRN: [id number]  HPI She is here for a physical exam.   She has the lap band and she wants it removed and will have a gastric sleeve.  [facility]  She needs paperwork signed for it.  She is trying to exercise regularly and will try to increase this.  She is still smoking and working on quitting.  She is not currently using the patch.  She is working on improving her diet and decreasing her portions.  She is experiencing leg pain intermittently, which is lumbar radiculopathy.  She has an appointment with Dr. Ethelene Halamos.  She is also following with podiatry for foot pain.  Medications and allergies reviewed with patient and updated if appropriate.  Patient Active Problem List   Diagnosis Date Noted  . Elevated blood pressure reading 07/12/2017  . Urinary frequency 03/04/2017  . Right foot pain 03/04/2017  . Prediabetes 03/12/2016  . Tingling 03/09/2016  . Sciatica of left side without back pain 11/12/2015  . Tobacco use disorder 11/12/2015  . Allergic rhinitis 11/12/2015  . Bariatric surgery status 10/07/2009  . OBESITY, MORBID 02/23/2007    Current Outpatient Medications on File Prior to Visit  Medication Sig Dispense Refill  . fluticasone (FLONASE) 50 MCG/ACT nasal spray Place 2 sprays into both nostrils daily. 16 g 6  . naproxen (NAPROSYN) 500 MG tablet Take 1 tablet (500 mg total) by mouth 2 (two) times daily. 60 tablet 2  . phentermine (ADIPEX-P) 37.5 MG tablet      No current facility-administered medications on file prior to visit.     Past Medical History:  Diagnosis Date  . STD (sexually transmitted disease)    trichomonas    Past Surgical History:  Procedure Laterality Date  . BREAST SURGERY  1997   breast reduction  . LAPAROSCOPIC GASTRIC BANDING  2010   done by [facility]    Social History   Socioeconomic History  .  Marital status: Divorced    Spouse name: None  . Number of children: None  . Years of education: None  . Highest education level: None  Social Needs  . Financial resource strain: None  . Food insecurity - worry: None  . Food insecurity - inability: None  . Transportation needs - medical: None  . Transportation needs - non-medical: None  Occupational History  . None  Tobacco Use  . Smoking status: Former Smoker    Packs/day: 0.50    Years: 30.00    Pack years: 15.00    Last attempt to quit: 05/08/2017    Years since quitting: 0.1  . Smokeless tobacco: Never Used  Substance and Sexual Activity  . Alcohol use: Yes    Alcohol/week: 0.0 oz    Comment: social  . Drug use: No  . Sexual activity: Not Currently    Birth control/protection: Condom  Other Topics Concern  . None  Social History Narrative  . None    Family History  Problem Relation Age of Onset  . Hypertension Father   . Diabetes Maternal Grandmother     Review of Systems  Constitutional: Negative for chills and fever.  Eyes: Negative for visual disturbance.  Respiratory: Negative for cough, shortness of breath and wheezing.   Cardiovascular: Positive for leg swelling (occasional). Negative for chest pain and palpitations.  Gastrointestinal: Negative for abdominal pain,  blood in stool, constipation, diarrhea and nausea.  Genitourinary: Negative for dysuria and hematuria.  Musculoskeletal: Positive for arthralgias (foot, left leg) and back pain (mild).  Skin: Negative for color change and rash.  Neurological: Positive for headaches (sinus). Negative for dizziness and light-headedness.  Psychiatric/Behavioral: Negative for dysphoric mood. The patient is not nervous/anxious.        Objective:   Vitals:   07/12/17 1509  BP: (!) 132/94  Pulse: 89  Resp: 16  Temp: 98.3 F (36.8 C)  SpO2: 98%   Filed Weights   07/12/17 1509  Weight: 299 lb (135.6 kg)   Body mass index is 46.83 kg/m.  Wt Readings  from Last 3 Encounters:  07/12/17 299 lb (135.6 kg)  06/11/17 (!) 301 lb (136.5 kg)  03/04/17 293 lb (132.9 kg)     Physical Exam Constitutional: She appears well-developed and well-nourished. No distress.  HENT:  Head: Normocephalic and atraumatic.  Right Ear: External ear normal. Normal ear canal and TM Left Ear: External ear normal.  Normal ear canal and TM Mouth/Throat: Oropharynx is clear and moist.  Eyes: Conjunctivae and EOM are normal.  Neck: Neck supple. No tracheal deviation present. No thyromegaly present.  No carotid bruit  Cardiovascular: Normal rate, regular rhythm and normal heart sounds.   No murmur heard.  No edema. Pulmonary/Chest: Effort normal and breath sounds normal. No respiratory distress. She has no wheezes. She has no rales.  Breast: deferred to Gyn Abdominal: Soft. She exhibits no distension. There is no tenderness.  Lymphadenopathy: She has no cervical adenopathy.  Skin: Skin is warm and dry. She is not diaphoretic.  Psychiatric: She has a normal mood and affect. Her behavior is normal.        Assessment & Plan:   Physical exam: Screening blood work  ordered Immunizations   Up to date  Mammogram   - due - will schedule Gyn  Pap Up to date  -- due for gyn -  Will schedule Exercise  regular Weight  Working on weight loss - hopes to have gastric sleeve Skin no concerns Substance abuse  none  See Problem List for Assessment and Plan of chronic medical problems.    Follow-up annually

## 2017-07-12 ENCOUNTER — Other Ambulatory Visit (INDEPENDENT_AMBULATORY_CARE_PROVIDER_SITE_OTHER): Payer: BLUE CROSS/BLUE SHIELD

## 2017-07-12 ENCOUNTER — Encounter: Payer: Self-pay | Admitting: Internal Medicine

## 2017-07-12 ENCOUNTER — Ambulatory Visit (INDEPENDENT_AMBULATORY_CARE_PROVIDER_SITE_OTHER): Payer: BLUE CROSS/BLUE SHIELD | Admitting: Internal Medicine

## 2017-07-12 VITALS — BP 132/94 | HR 89 | Temp 98.3°F | Resp 16 | Wt 299.0 lb

## 2017-07-12 DIAGNOSIS — R7303 Prediabetes: Secondary | ICD-10-CM

## 2017-07-12 DIAGNOSIS — Z0000 Encounter for general adult medical examination without abnormal findings: Secondary | ICD-10-CM

## 2017-07-12 DIAGNOSIS — R030 Elevated blood-pressure reading, without diagnosis of hypertension: Secondary | ICD-10-CM | POA: Diagnosis not present

## 2017-07-12 DIAGNOSIS — Z0001 Encounter for general adult medical examination with abnormal findings: Secondary | ICD-10-CM

## 2017-07-12 LAB — COMPREHENSIVE METABOLIC PANEL
ALT: 12 U/L (ref 0–35)
AST: 10 U/L (ref 0–37)
Albumin: 3.8 g/dL (ref 3.5–5.2)
Alkaline Phosphatase: 66 U/L (ref 39–117)
BILIRUBIN TOTAL: 0.2 mg/dL (ref 0.2–1.2)
BUN: 15 mg/dL (ref 6–23)
CALCIUM: 9.3 mg/dL (ref 8.4–10.5)
CO2: 26 meq/L (ref 19–32)
CREATININE: 0.66 mg/dL (ref 0.40–1.20)
Chloride: 103 mEq/L (ref 96–112)
GFR: 122.97 mL/min (ref >60.00)
GLUCOSE: 87 mg/dL (ref 70–99)
Potassium: 3.7 mEq/L (ref 3.5–5.1)
SODIUM: 136 meq/L (ref 135–145)
Total Protein: 8.1 g/dL (ref 6.0–8.3)

## 2017-07-12 LAB — LIPID PANEL
CHOL/HDL RATIO: 4
Cholesterol: 156 mg/dL (ref 0–200)
HDL: 42 mg/dL (ref >39.00)
LDL Cholesterol: 98 mg/dL (ref 0–99)
NONHDL: 114.01
Triglycerides: 78 mg/dL (ref 0.0–149.0)
VLDL: 15.6 mg/dL (ref 0.0–40.0)

## 2017-07-12 LAB — CBC WITH DIFFERENTIAL/PLATELET
BASOS ABS: 0 10*3/uL (ref 0.0–0.1)
BASOS PCT: 0.6 % (ref 0.0–3.0)
EOS ABS: 0.2 10*3/uL (ref 0.0–0.7)
Eosinophils Relative: 2.7 % (ref 0.0–5.0)
HCT: 40 % (ref 36.0–46.0)
Hemoglobin: 12.6 g/dL (ref 12.0–15.0)
LYMPHS ABS: 2.5 10*3/uL (ref 0.7–4.0)
LYMPHS PCT: 30.1 % (ref 12.0–46.0)
MCHC: 31.6 g/dL (ref 30.0–36.0)
MCV: 67.3 fl — ABNORMAL LOW (ref 78.0–100.0)
MONO ABS: 0.7 10*3/uL (ref 0.1–1.0)
Monocytes Relative: 8.3 % (ref 3.0–12.0)
NEUTROS PCT: 58.3 % (ref 43.0–77.0)
Neutro Abs: 4.8 10*3/uL (ref 1.4–7.7)
Platelets: 322 10*3/uL (ref 150.0–400.0)
RBC: 5.94 Mil/uL — ABNORMAL HIGH (ref 3.87–5.11)
RDW: 18 % — AB (ref 11.5–15.5)
WBC: 8.2 10*3/uL (ref 4.0–10.5)

## 2017-07-12 LAB — TSH: TSH: 1.23 u[IU]/mL (ref 0.35–4.50)

## 2017-07-12 LAB — HEMOGLOBIN A1C: HEMOGLOBIN A1C: 6.3 % (ref 4.6–6.5)

## 2017-07-12 NOTE — Assessment & Plan Note (Addendum)
Elevated today ? Related to phentermine or leg pain, which is what she feels it is related to Will check tsh, cmp Continue regular exercise and weight loss efforts Most likely will be undergoing gastric sleeve in the next couple of months

## 2017-07-12 NOTE — Assessment & Plan Note (Signed)
Has had a lap band-this is not been successful for her She plans on having this removed and will undergo a gastric sleeve in the next couple of months Paperwork signed today for surgery

## 2017-07-12 NOTE — Assessment & Plan Note (Signed)
Check a1c Low sugar / carb diet Stressed regular exercise   

## 2017-07-13 ENCOUNTER — Encounter: Payer: Self-pay | Admitting: Internal Medicine

## 2018-05-03 NOTE — Progress Notes (Signed)
Subjective:    Patient ID: Michaela Carr, female    DOB: 06/23/1970, 47 y.o.   MRN: [id number]  HPI She is here for an acute visit for cold symptoms.  Her symptoms started 2 weeks ago when traveling  She went to urgent care and she was prescribed a mouth wash and cough syrup.  Her symptoms have not improved.  She is experiencing fatigue, rhinorrhea, nasal congestion, sore throat, productive cough and nausea.  She denies any fevers, ear pain, sinus pain, shortness of breath and wheezing.  She has not had a lot of headaches or lightheadedness.  She has taken the above and cough drops and nasal spray  Medications and allergies reviewed with patient and updated if appropriate.  Patient Active Problem List   Diagnosis Date Noted  . Elevated blood pressure reading 07/12/2017  . Right foot pain 03/04/2017  . Prediabetes 03/12/2016  . Tingling 03/09/2016  . Sciatica of left side without back pain 11/12/2015  . Tobacco use disorder 11/12/2015  . Allergic rhinitis 11/12/2015  . Bariatric surgery status 10/07/2009  . OBESITY, MORBID 02/23/2007    Current Outpatient Medications on File Prior to Visit  Medication Sig Dispense Refill  . fluticasone (FLONASE) 50 MCG/ACT nasal spray Place 2 sprays into both nostrils daily. 16 g 6  . naproxen (NAPROSYN) 500 MG tablet Take 1 tablet (500 mg total) by mouth 2 (two) times daily. 60 tablet 2   No current facility-administered medications on file prior to visit.     Past Medical History:  Diagnosis Date  . STD (sexually transmitted disease)    trichomonas    Past Surgical History:  Procedure Laterality Date  . BREAST SURGERY  1997   breast reduction  . LAPAROSCOPIC GASTRIC BANDING  2010   done by [facility]    Social History   Socioeconomic History  . Marital status: Divorced    Spouse name: Not on file  . Number of children: Not on file  . Years of education: Not on file  . Highest education level: Not on file    Occupational History  . Not on file  Social Needs  . Financial resource strain: Not on file  . Food insecurity:    Worry: Not on file    Inability: Not on file  . Transportation needs:    Medical: Not on file    Non-medical: Not on file  Tobacco Use  . Smoking status: Former Smoker    Packs/day: 0.50    Years: 30.00    Pack years: 15.00    Last attempt to quit: 05/08/2017    Years since quitting: 0.9  . Smokeless tobacco: Never Used  Substance and Sexual Activity  . Alcohol use: Yes    Alcohol/week: 0.0 standard drinks    Comment: social  . Drug use: No  . Sexual activity: Not Currently    Birth control/protection: Condom  Lifestyle  . Physical activity:    Days per week: Not on file    Minutes per session: Not on file  . Stress: Not on file  Relationships  . Social connections:    Talks on phone: Not on file    Gets together: Not on file    Attends religious service: Not on file    Active member of club or organization: Not on file    Attends meetings of clubs or organizations: Not on file    Relationship status: Not on file  Other Topics Concern  .  Not on file  Social History Narrative  . Not on file    Family History  Problem Relation Age of Onset  . Hypertension Father   . Diabetes Maternal Grandmother     Review of Systems  Constitutional: Positive for fatigue. Negative for chills and fever.  HENT: Positive for congestion (improved), rhinorrhea and sore throat (intermittent). Negative for ear pain (ears clogged) and sinus pain.   Respiratory: Positive for cough (some sputum). Negative for chest tightness, shortness of breath and wheezing.   Gastrointestinal: Positive for nausea (mild). Negative for diarrhea.  Musculoskeletal: Negative for myalgias.  Neurological: Negative for light-headedness and headaches.       Objective:   Vitals:   05/04/18 1511  BP: (!) 154/82  Pulse: 80  Resp: 16  Temp: 98.4 F (36.9 C)  SpO2: 99%   Filed Weights    05/04/18 1511  Weight: (!) 308 lb (139.7 kg)   Body mass index is 48.24 kg/m.  Wt Readings from Last 3 Encounters:  05/04/18 (!) 308 lb (139.7 kg)  07/12/17 299 lb (135.6 kg)  06/11/17 (!) 301 lb (136.5 kg)     Physical Exam GENERAL APPEARANCE: Appears stated age, well appearing, NAD EYES: conjunctiva clear, no icterus HEENT: bilateral tympanic membranes and ear canals normal, oropharynx with mild erythema, no thyromegaly, trachea midline, no cervical or supraclavicular lymphadenopathy LUNGS: Clear to auscultation without wheeze or crackles, unlabored breathing, good air entry bilaterally CARDIOVASCULAR: Normal S1,S2 without murmurs, no edema SKIN: warm, dry        Assessment & Plan:   See Problem List for Assessment and Plan of chronic medical problems.

## 2018-05-04 ENCOUNTER — Ambulatory Visit: Payer: BLUE CROSS/BLUE SHIELD | Admitting: Internal Medicine

## 2018-05-04 ENCOUNTER — Encounter: Payer: Self-pay | Admitting: Internal Medicine

## 2018-05-04 VITALS — BP 154/82 | HR 80 | Temp 98.4°F | Resp 16 | Ht 67.0 in | Wt 308.0 lb

## 2018-05-04 DIAGNOSIS — J069 Acute upper respiratory infection, unspecified: Secondary | ICD-10-CM | POA: Diagnosis not present

## 2018-05-04 MED ORDER — AZITHROMYCIN 250 MG PO TABS: ORAL_TABLET | ORAL | 0 refills | Status: DC

## 2018-05-04 NOTE — Patient Instructions (Signed)
Take the antibiotic only if your symptoms do not improve in the next 1-2 days.    Take over the counter cold medications as needed.   Get as much rest and fluids as possible.        Upper Respiratory Infection, Adult Most upper respiratory infections (URIs) are a viral infection of the air passages leading to the lungs. A URI affects the nose, throat, and upper air passages. The most common type of URI is nasopharyngitis and is typically referred to as "the common cold." URIs run their course and usually go away on their own. Most of the time, a URI does not require medical attention, but sometimes a bacterial infection in the upper airways can follow a viral infection. This is called a secondary infection. Sinus and middle ear infections are common types of secondary upper respiratory infections. Bacterial pneumonia can also complicate a URI. A URI can worsen asthma and chronic obstructive pulmonary disease (COPD). Sometimes, these complications can require emergency medical care and may be life threatening. What are the causes? Almost all URIs are caused by viruses. A virus is a type of germ and can spread from one person to another. What increases the risk? You may be at risk for a URI if:  You smoke.  You have chronic heart or lung disease.  You have a weakened defense (immune) system.  You are very young or very old.  You have nasal allergies or asthma.  You work in crowded or poorly ventilated areas.  You work in health care facilities or schools.  What are the signs or symptoms? Symptoms typically develop 2-3 days after you come in contact with a cold virus. Most viral URIs last 7-10 days. However, viral URIs from the influenza virus (flu virus) can last 14-18 days and are typically more severe. Symptoms may include:  Runny or stuffy (congested) nose.  Sneezing.  Cough.  Sore throat.  Headache.  Fatigue.  Fever.  Loss of appetite.  Pain in your forehead,  behind your eyes, and over your cheekbones (sinus pain).  Muscle aches.  How is this diagnosed? Your health care provider may diagnose a URI by:  Physical exam.  Tests to check that your symptoms are not due to another condition such as: ? Strep throat. ? Sinusitis. ? Pneumonia. ? Asthma.  How is this treated? A URI goes away on its own with time. It cannot be cured with medicines, but medicines may be prescribed or recommended to relieve symptoms. Medicines may help:  Reduce your fever.  Reduce your cough.  Relieve nasal congestion.  Follow these instructions at home:  Take medicines only as directed by your health care provider.  Gargle warm saltwater or take cough drops to comfort your throat as directed by your health care provider.  Use a warm mist humidifier or inhale steam from a shower to increase air moisture. This may make it easier to breathe.  Drink enough fluid to keep your urine clear or pale yellow.  Eat soups and other clear broths and maintain good nutrition.  Rest as needed.  Return to work when your temperature has returned to normal or as your health care provider advises. You may need to stay home longer to avoid infecting others. You can also use a face mask and careful hand washing to prevent spread of the virus.  Increase the usage of your inhaler if you have asthma.  Do not use any tobacco products, including cigarettes, chewing tobacco, or electronic cigarettes.  If you need help quitting, ask your health care provider. How is this prevented? The best way to protect yourself from getting a cold is to practice good hygiene.  Avoid oral or hand contact with people with cold symptoms.  Wash your hands often if contact occurs.  There is no clear evidence that vitamin C, vitamin E, echinacea, or exercise reduces the chance of developing a cold. However, it is always recommended to get plenty of rest, exercise, and practice good nutrition. Contact  a health care provider if:  You are getting worse rather than better.  Your symptoms are not controlled by medicine.  You have chills.  You have worsening shortness of breath.  You have brown or red mucus.  You have yellow or brown nasal discharge.  You have pain in your face, especially when you bend forward.  You have a fever.  You have swollen neck glands.  You have pain while swallowing.  You have white areas in the back of your throat. Get help right away if:  You have severe or persistent: ? Headache. ? Ear pain. ? Sinus pain. ? Chest pain.  You have chronic lung disease and any of the following: ? Wheezing. ? Prolonged cough. ? Coughing up blood. ? A change in your usual mucus.  You have a stiff neck.  You have changes in your: ? Vision. ? Hearing. ? Thinking. ? Mood. This information is not intended to replace advice given to you by your health care provider. Make sure you discuss any questions you have with your health care provider. Document Released: 11/18/2000 Document Revised: 01/26/2016 Document Reviewed: 08/30/2013 Elsevier Interactive Patient Education  [employer]2018 [employer]

## 2018-05-05 DIAGNOSIS — J069 Acute upper respiratory infection, unspecified: Secondary | ICD-10-CM | POA: Insufficient documentation

## 2018-05-05 NOTE — Assessment & Plan Note (Signed)
She has feels slightly better today Continue Flonase and over-the-counter cold medications Increase rest and fluids as much as possible I have given her an antibiotic, but advised her not to take this unless her symptoms do not improve over the next 1-2 days-discussed it is better not to take the medication, but I do want her to go through the weekends and continued to get worse Call if no improvement

## 2019-03-15 DIAGNOSIS — M5416 Radiculopathy, lumbar region: Secondary | ICD-10-CM | POA: Insufficient documentation

## 2019-05-21 ENCOUNTER — Emergency Department (HOSPITAL_COMMUNITY): Payer: BC Managed Care – PPO

## 2019-05-21 ENCOUNTER — Emergency Department (HOSPITAL_COMMUNITY)
Admission: EM | Admit: 2019-05-21 | Discharge: 2019-05-21 | Disposition: A | Discharge status: Home / Self Care | Payer: BC Managed Care – PPO | Attending: Emergency Medicine | Admitting: Emergency Medicine

## 2019-05-21 ENCOUNTER — Encounter (HOSPITAL_COMMUNITY): Payer: Self-pay | Admitting: Obstetrics and Gynecology

## 2019-05-21 ENCOUNTER — Other Ambulatory Visit: Payer: Self-pay

## 2019-05-21 DIAGNOSIS — M542 Cervicalgia: Secondary | ICD-10-CM | POA: Insufficient documentation

## 2019-05-21 DIAGNOSIS — F17200 Nicotine dependence, unspecified, uncomplicated: Secondary | ICD-10-CM | POA: Diagnosis not present

## 2019-05-21 MED ORDER — PREDNISONE 20 MG PO TABS
60.0000 mg | ORAL_TABLET | Freq: Once | ORAL | Status: AC
  Administered 2019-05-21: 60 mg via ORAL
  Filled 2019-05-21: qty 3

## 2019-05-21 MED ORDER — METHOCARBAMOL 500 MG PO TABS: 500.0000 mg | ORAL_TABLET | Freq: Three times a day (TID) | ORAL | 0 refills | Status: DC | PRN

## 2019-05-21 MED ORDER — PREDNISONE 10 MG (21) PO TBPK: ORAL_TABLET | Freq: Every day | ORAL | 0 refills | Status: DC

## 2019-05-21 NOTE — ED Provider Notes (Signed)
[facility] DEPT Provider Note   CSN: [id number] Arrival date & time: 05/21/19  1761     History Chief Complaint  Patient presents with  . Arm Pain  . Neck Pain    DEAUNDRA DUPRIEST is a 48 y.o. female with a history of tobacco abuse, obesity, & prediabetes who presents to the ED with complaints of L neck/back/UE pain which has been progressively worsening over the past 1 week. Patient states pain is in the L neck/upper back & radiates into the LUE, it is constant, achy, worse with certain positions/movements, mildly alleviated with heat but not resolved. She occasionally gets intermittent paresthesias to the L hand but these are not persistent. She denies specific traumatic injury. She states that she does a lot of pushing/pulling motions at her job and she is left handed, she also recently started exercising again. Denies numbness, weakness, saddle anesthesia, incontinence to bowel/bladder, fever, chills, IV drug use, or hx of cancer. Denies chest pain/dyspnea.    HPI     Past Medical History:  Diagnosis Date  . STD (sexually transmitted disease)    trichomonas    Patient Active Problem List   Diagnosis Date Noted  . Upper respiratory tract infection 05/05/2018  . Elevated blood pressure reading 07/12/2017  . Right foot pain 03/04/2017  . Prediabetes 03/12/2016  . Tingling 03/09/2016  . Sciatica of left side without back pain 11/12/2015  . Tobacco use disorder 11/12/2015  . Allergic rhinitis 11/12/2015  . Bariatric surgery status 10/07/2009  . OBESITY, MORBID 02/23/2007    Past Surgical History:  Procedure Laterality Date  . BREAST SURGERY  1997   breast reduction  . LAPAROSCOPIC GASTRIC BANDING  2010   done by [facility]     OB History    Gravida  1   Para      Term      Preterm      AB  1   Living        SAB  1   TAB      Ectopic      Multiple      Live Births              Family History  Problem  Relation Age of Onset  . Hypertension Father   . Diabetes Maternal Grandmother     Social History   Tobacco Use  . Smoking status: Current Every Day Smoker    Packs/day: 0.50    Years: 30.00    Pack years: 15.00    Last attempt to quit: 05/08/2017    Years since quitting: 2.0  . Smokeless tobacco: Never Used  Substance Use Topics  . Alcohol use: Yes    Alcohol/week: 0.0 standard drinks    Comment: social  . Drug use: No    Home Medications Prior to Admission medications   Medication Sig Start Date End Date Taking? Authorizing Provider  azithromycin (ZITHROMAX) 250 MG tablet Take two tabs the first day and then one tab daily for four days 05/04/18   Binnie Rail, MD  fluticasone (FLONASE) 50 MCG/ACT nasal spray Place 2 sprays into both nostrils daily. 06/11/17   Marrian Salvage, FNP  naproxen (NAPROSYN) 500 MG tablet Take 1 tablet (500 mg total) by mouth 2 (two) times daily. 03/04/17   Biagio Borg, MD    Allergies    Patient has no known allergies.  Review of Systems   Review of Systems  Constitutional: Negative  for chills and fever.  Eyes: Negative for visual disturbance.  Respiratory: Negative for shortness of breath.   Cardiovascular: Negative for chest pain.  Gastrointestinal: Negative for abdominal pain.  Musculoskeletal: Positive for arthralgias, back pain, myalgias and neck pain.  Neurological: Negative for dizziness, weakness, numbness and headaches.       Positive for intermittent L hand paresthesias. Negative for incontinence or saddle anesthesia.     Physical Exam Updated Vital Signs BP (!) 195/105 (BP Location: Right Arm)   Pulse 79   Temp 98.1 F (36.7 C) (Oral)   Resp 17   SpO2 98%   Physical Exam Vitals and nursing note reviewed.  Constitutional:      General: She is not in acute distress.    Appearance: Normal appearance. She is not ill-appearing or toxic-appearing.  HENT:     Head: Normocephalic and atraumatic.  Cardiovascular:      Rate and Rhythm: Normal rate.     Pulses:          Radial pulses are 2+ on the right side and 2+ on the left side.  Pulmonary:     Effort: No respiratory distress.     Breath sounds: Normal breath sounds.  Musculoskeletal:     Cervical back: Normal range of motion and neck supple. Spinous process tenderness (diffuse, non focal, no step off) and muscular tenderness (left sided) present.     Comments: Back: thoracic diffuse upper midline & left sided paraspinal muscle tenderness. NO point/focal vertebral tenderness or palpable step off. L spine nontender.  Upper extremities: No obvious deformity, appreciable swelling, edema, erythema, ecchymosis, warmth, or open wounds. Patient has intact AROM throughout. NO point/focal bony tenderness. Tender to palpation over the left trapezius/deltoid region.   Skin:    General: Skin is warm and dry.     Capillary Refill: Capillary refill takes less than 2 seconds.  Neurological:     Mental Status: She is alert.     Comments: Alert. Clear speech. Sensation grossly intact to bilateral upper extremities. 5/5 symmetric grip strength as well as strength with wrist/elbow/shoulder flexion/extension bilaterally. Ambulatory.   Psychiatric:        Mood and Affect: Mood normal.        Behavior: Behavior normal.     ED Results / Procedures / Treatments   Labs (all labs ordered are listed, but only abnormal results are displayed) Labs Reviewed - No data to display  EKG None  Radiology DG Thoracic Spine 2 View  Result Date: 05/21/2019 CLINICAL DATA:  Patient states no injury. Worsening back/shoulder pain over the past day. EXAM: THORACIC SPINE 2 VIEWS COMPARISON:  None. FINDINGS: No fracture or bone lesion.  No spondylolisthesis. Mild disc degenerative changes reflected by mild loss of disc height and small endplate osteophytes mostly along the mid to lower thoracic spine. Mild curvature, convex the right, apex at T9. Soft tissues are unremarkable. IMPRESSION:  1. No fracture or acute finding. 2. Mild disc degenerative changes and mild dextroscoliosis. Electronically Signed   By: Amie Portland M.D.   On: 05/21/2019 11:07   CT Cervical Spine Wo Contrast  Result Date: 05/21/2019 CLINICAL DATA:  Posterior left-sided neck pain for 5 days with no known injury. EXAM: CT CERVICAL SPINE WITHOUT CONTRAST TECHNIQUE: Multidetector CT imaging of the cervical spine was performed without intravenous contrast. Multiplanar CT image reconstructions were also generated. COMPARISON:  None. FINDINGS: Alignment: Reversed cervical lordosis, apex at C5. No spondylolisthesis. Skull base and vertebrae: No acute fracture. No primary  bone lesion or focal pathologic process. Soft tissues and spinal canal: No prevertebral fluid or swelling. No visible canal hematoma. Disc levels: Mild to moderate loss of disc height from C3-C4 through C6-C7. There is spondylotic disc bulging and endplate spurring at these levels. No evidence of a disc herniation. Uncovertebral spurring causes mild right and moderate left neural foraminal narrowing at C6-C7. No other stenosis. Upper chest: Soft tissue masses or adenopathy. No acute findings. Clear lung apices. Other: None. IMPRESSION: 1. No fracture or acute finding.  No spondylolisthesis/subluxation. 2. Reversed cervical lordosis. Degenerative changes from C3-C4 through C6-C7 as described. 3. Moderate left and mild right neural foraminal narrowing from uncovertebral spurring at C6-C7. No evidence of a disc herniation. Electronically Signed   By: Amie Portlandavid  Ormond M.D.   On: 05/21/2019 11:58   DG Shoulder Left  Result Date: 05/21/2019 CLINICAL DATA:  Patient states no injury. Worsening back/shoulder pain over the past day. EXAM: LEFT SHOULDER - 2+ VIEW COMPARISON:  None. FINDINGS: No fracture or bone lesion. Glenohumeral joint normally spaced and aligned. Small marginal osteophytes at the [facility]. AC joint is normally spaced and aligned. Soft tissues are  unremarkable. IMPRESSION: 1. No fracture or acute finding. 2. Mild AC joint osteoarthritis. No glenohumeral joint arthropathic changes. Electronically Signed   By: Amie Portlandavid  Ormond M.D.   On: 05/21/2019 11:08    Procedures Procedures (including critical care time)  Medications Ordered in ED Medications - No data to display  ED Course/MDM I have reviewed the triage vital signs and the nursing notes.  Pertinent labs & imaging results that were available during my care of the patient were reviewed by me and considered in my medical decision making (see chart for details).  Patient presents to the ED with left sided neck/back/UE discomfort x 1 week. L hand dominant with a great deal of pushing/pulling motions at work. Nontoxic appearing, vitals w/ elevated BP- I personally placed appropriate size BP cuff on patient and repeated her blood pressure- improved to 150/90 but remains hypertensive, doubt HTN emergency, PCP recheck. She has no fever, erythema/warmth, wounds, or hx of IVUD to suggest infectious process such as epidural abscess, septic joint, or cellulitis.  CT C spine without fracture/subluxation/sponylolisthesis. No acute findings. She does have some degenerative changes & Moderate left and mild right neural foraminal narrowing from uncovertebral spurring at C6-C7. No evidence of a disc herniation. Neural foraminal narrowing may be contributory to her sxs, however she has no focal neuro deficits on exam, not consistent with cord compression. L shoulder & T spine xrays with degenerative changes. Will trial robaxin & steroid taper with PCP follow up, she may need neurosurgery follow up should she continue to have sxs- this information was provided as well. I discussed results, treatment plan, need for follow-up, and return precautions with the patient. Provided opportunity for questions, patient confirmed understanding and is in agreement with plan.    Final Clinical Impression(s) / ED  Diagnoses Final diagnoses:  Neck pain    Rx / DC Orders ED Discharge Orders         Ordered    predniSONE (STERAPRED UNI-PAK 21 TAB) 10 MG (21) TBPK tablet  Daily     05/21/19 1216    methocarbamol (ROBAXIN) 500 MG tablet  Every 8 hours PRN     05/21/19 1216           Mersadie Kavanaugh, AldrichSamantha R, PA-C 05/21/19 1218    Benjiman CorePickering, Nathan, MD 05/21/19 1500

## 2019-05-21 NOTE — ED Triage Notes (Signed)
Patient reports left arm and neck pain. Patient reports she has had no injury/falls/trauma. Patient reports she woke up to pain the shoots up her arm and into her neck and back.  Patient reports she does not take medication for high BP and believes it is high due to the pain.

## 2019-05-21 NOTE — ED Notes (Signed)
ED Provider at bedside. 

## 2019-05-21 NOTE — Discharge Instructions (Addendum)
You were seen in the emergency department today for neck, back, and left arm pain.  Your cervical spine CT as well as your x-rays of your left shoulder and your mid back both showed some degenerative changes, these may be contributing to your symptoms.  Send you home with the following medicines: -Prednisone taper: This is a steroid, please take this as prescribed to help with inflammation.  Do not take NSAIDs with this medicine such as ibuprofen, Motrin, Aleve, Advil, naproxen, Mobic, etc. as these are similar medicines and increases your risk for stomach irritation/bleeding. -Robaxin is the muscle relaxer I have prescribed, this is meant to help with muscle tightness. Be aware that this medication may make you drowsy therefore the first time you take this it should be at a time you are in an environment where you can rest. Do not drive or operate heavy machinery when taking this medication. Do not drink alcohol or take other sedating medications with this medicine such as narcotics or benzodiazepines.   You make take Tylenol per over the counter dosing with these medications.   We have prescribed you new medication(s) today. Discuss the medications prescribed today with your pharmacist as they can have adverse effects and interactions with your other medicines including over the counter and prescribed medications. Seek medical evaluation if you start to experience new or abnormal symptoms after taking one of these medicines, seek care immediately if you start to experience difficulty breathing, feeling of your throat closing, facial swelling, or rash as these could be indications of a more serious allergic reaction  We would like you to follow-up with your primary care provider within 3 to 5 days for reevaluation.  We have also provided neurosurgery information, you may also follow-up with your orthopedic doctor.  Return to the ER for new or worsening symptoms including but not limited to increased pain,  numbness, weakness, or any other concerns.   Please also have your blood pressure rechecked at your primary care appointment as it was elevated in the ER today.  Blood pressure (!) 150/90, pulse 79, temperature 98.1 F (36.7 C), temperature source Oral, resp. rate 17, SpO2 98 %.

## 2019-05-23 ENCOUNTER — Ambulatory Visit (INDEPENDENT_AMBULATORY_CARE_PROVIDER_SITE_OTHER): Payer: BC Managed Care – PPO | Admitting: Internal Medicine

## 2019-05-23 ENCOUNTER — Encounter: Payer: Self-pay | Admitting: Internal Medicine

## 2019-05-23 ENCOUNTER — Other Ambulatory Visit: Payer: Self-pay

## 2019-05-23 VITALS — BP 152/86 | HR 78 | Temp 98.1°F | Resp 16 | Ht 67.0 in | Wt 292.0 lb

## 2019-05-23 DIAGNOSIS — M549 Dorsalgia, unspecified: Secondary | ICD-10-CM

## 2019-05-23 DIAGNOSIS — M546 Pain in thoracic spine: Secondary | ICD-10-CM | POA: Insufficient documentation

## 2019-05-23 MED ORDER — FLUTICASONE PROPIONATE 50 MCG/ACT NA SUSP: 2.0000 | Freq: Every day | NASAL | 6 refills | Status: DC

## 2019-05-23 MED ORDER — KETOROLAC TROMETHAMINE 30 MG/ML IJ SOLN
30.0000 mg | Freq: Once | INTRAMUSCULAR | Status: AC
  Administered 2019-05-23: 17:00:00 30 mg via INTRAMUSCULAR

## 2019-05-23 MED ORDER — KETOROLAC TROMETHAMINE 30 MG/ML IJ SOLN
30.0000 mg | Freq: Once | INTRAMUSCULAR | Status: AC
  Administered 2019-05-23: 30 mg via INTRAMUSCULAR

## 2019-05-23 NOTE — Assessment & Plan Note (Signed)
Muscular in nature with radiculopathy done left arm Referred to PT Will try massage therapy Continue prednisone and robaxin Toradol 60 mg IM  Apply heat, stretch gently  Revise actvities

## 2019-05-23 NOTE — Patient Instructions (Addendum)
You received a pain injection here - Toradol.     Continue the medications prescribed in the ED.   A referral was ordered for PT.     Try massage therapy.

## 2019-05-23 NOTE — Progress Notes (Signed)
Subjective:    Patient ID: Michaela Carr, female    DOB: 1971/02/03, 48 y.o.   MRN: [id number]  HPI The patient is here for an acute visit.  She went to the ED 12/13 for left upper back / neck pain that started 1-2 weeks ago.  She thinks she may have something at work - she often has to lift, pull/push things.  She started having left arm tingling and pain and her left hand is a little swollen - she was concerned, so she went to the ED. She was told it was MSK.  She had imaging done of her c-spine, t-spine and left shoulder.  She has been applying heat, icy hot.  She was prescribed prednisone and robaxin.  The medication helps, but she has not seen much improvement.         Medications and allergies reviewed with patient and updated if appropriate.  Patient Active Problem List   Diagnosis Date Noted  . Upper respiratory tract infection 05/05/2018  . Elevated blood pressure reading 07/12/2017  . Right foot pain 03/04/2017  . Prediabetes 03/12/2016  . Tingling 03/09/2016  . Sciatica of left side without back pain 11/12/2015  . Tobacco use disorder 11/12/2015  . Allergic rhinitis 11/12/2015  . Bariatric surgery status 10/07/2009  . OBESITY, MORBID 02/23/2007    Current Outpatient Medications on File Prior to Visit  Medication Sig Dispense Refill  . methocarbamol (ROBAXIN) 500 MG tablet Take 1 tablet (500 mg total) by mouth every 8 (eight) hours as needed. 15 tablet 0  . predniSONE (STERAPRED UNI-PAK 21 TAB) 10 MG (21) TBPK tablet Take by mouth daily. 6, 5, 4, 3, 2, 1 21 tablet 0   No current facility-administered medications on file prior to visit.    Past Medical History:  Diagnosis Date  . STD (sexually transmitted disease)    trichomonas    Past Surgical History:  Procedure Laterality Date  . BREAST SURGERY  1997   breast reduction  . LAPAROSCOPIC GASTRIC BANDING  2010   done by [facility]    Social History   Socioeconomic History  . Marital status:  Divorced    Spouse name: Not on file  . Number of children: Not on file  . Years of education: Not on file  . Highest education level: Not on file  Occupational History  . Not on file  Tobacco Use  . Smoking status: Current Every Day Smoker    Packs/day: 0.50    Years: 30.00    Pack years: 15.00    Last attempt to quit: 05/08/2017    Years since quitting: 2.0  . Smokeless tobacco: Never Used  Substance and Sexual Activity  . Alcohol use: Yes    Alcohol/week: 0.0 standard drinks    Comment: social  . Drug use: No  . Sexual activity: Not Currently    Birth control/protection: Condom  Other Topics Concern  . Not on file  Social History Narrative  . Not on file   Social Determinants of Health   Financial Resource Strain:   . Difficulty of Paying Living Expenses: Not on file  Food Insecurity:   . Worried About Charity fundraiser in the Last Year: Not on file  . Ran Out of Food in the Last Year: Not on file  Transportation Needs:   . Lack of Transportation (Medical): Not on file  . Lack of Transportation (Non-Medical): Not on file  Physical Activity:   . Days  of Exercise per Week: Not on file  . Minutes of Exercise per Session: Not on file  Stress:   . Feeling of Stress : Not on file  Social Connections:   . Frequency of Communication with Friends and Family: Not on file  . Frequency of Social Gatherings with Friends and Family: Not on file  . Attends Religious Services: Not on file  . Active Member of Clubs or Organizations: Not on file  . Attends Banker Meetings: Not on file  . Marital Status: Not on file    Family History  Problem Relation Age of Onset  . Hypertension Father   . Diabetes Maternal Grandmother     Review of Systems  Constitutional: Negative for chills and fever.  Musculoskeletal: Positive for back pain and neck pain.  Neurological: Positive for numbness. Negative for dizziness, weakness, light-headedness and headaches.         Objective:   Vitals:   05/23/19 1533  BP: (!) 152/86  Pulse: 78  Resp: 16  Temp: 98.1 F (36.7 C)  SpO2: 99%   BP Readings from Last 3 Encounters:  05/23/19 (!) 152/86  05/21/19 138/69  05/04/18 (!) 154/82   Wt Readings from Last 3 Encounters:  05/23/19 292 lb (132.5 kg)  05/04/18 (!) 308 lb (139.7 kg)  07/12/17 299 lb (135.6 kg)   Body mass index is 45.73 kg/m.   Physical Exam Constitutional:      Appearance: Normal appearance.  Musculoskeletal:        General: Swelling (mild left hand) and tenderness (left upper back and neck pain - muscular region) present. No deformity.  Skin:    General: Skin is warm and dry.     Findings: No erythema.  Neurological:     Mental Status: She is alert.     Sensory: No sensory deficit.     Motor: No weakness.        CT Cervical Spine Wo Contrast CLINICAL DATA:  Posterior left-sided neck pain for 5 days with no known injury.  EXAM: CT CERVICAL SPINE WITHOUT CONTRAST  TECHNIQUE: Multidetector CT imaging of the cervical spine was performed without intravenous contrast. Multiplanar CT image reconstructions were also generated.  COMPARISON:  None.  FINDINGS: Alignment: Reversed cervical lordosis, apex at C5. No spondylolisthesis.  Skull base and vertebrae: No acute fracture. No primary bone lesion or focal pathologic process.  Soft tissues and spinal canal: No prevertebral fluid or swelling. No visible canal hematoma.  Disc levels: Mild to moderate loss of disc height from C3-C4 through C6-C7. There is spondylotic disc bulging and endplate spurring at these levels. No evidence of a disc herniation. Uncovertebral spurring causes mild right and moderate left neural foraminal narrowing at C6-C7. No other stenosis.  Upper chest: Soft tissue masses or adenopathy. No acute findings. Clear lung apices.  Other: None.  IMPRESSION: 1. No fracture or acute finding.  No spondylolisthesis/subluxation. 2. Reversed cervical  lordosis. Degenerative changes from C3-C4 through C6-C7 as described. 3. Moderate left and mild right neural foraminal narrowing from uncovertebral spurring at C6-C7. No evidence of a disc herniation.  Electronically Signed   By: Amie Portland M.D.   On: 05/21/2019 11:58 DG Shoulder Left CLINICAL DATA:  Patient states no injury. Worsening back/shoulder pain over the past day.  EXAM: LEFT SHOULDER - 2+ VIEW  COMPARISON:  None.  FINDINGS: No fracture or bone lesion.  Glenohumeral joint normally spaced and aligned.  Small marginal osteophytes at the [facility] joint. AC joint is  normally spaced and aligned.  Soft tissues are unremarkable.  IMPRESSION: 1. No fracture or acute finding. 2. Mild AC joint osteoarthritis. No glenohumeral joint arthropathic changes.  Electronically Signed   By: Amie Portlandavid  Ormond M.D.   On: 05/21/2019 11:08 DG Thoracic Spine 2 View CLINICAL DATA:  Patient states no injury. Worsening back/shoulder pain over the past day.  EXAM: THORACIC SPINE 2 VIEWS  COMPARISON:  None.  FINDINGS: No fracture or bone lesion.  No spondylolisthesis.  Mild disc degenerative changes reflected by mild loss of disc height and small endplate osteophytes mostly along the mid to lower thoracic spine. Mild curvature, convex the right, apex at T9.  Soft tissues are unremarkable.  IMPRESSION: 1. No fracture or acute finding. 2. Mild disc degenerative changes and mild dextroscoliosis.  Electronically Signed   By: Amie Portlandavid  Ormond M.D.   On: 05/21/2019 11:07      Assessment & Plan:    See Problem List for Assessment and Plan of chronic medical problems.

## 2019-06-16 ENCOUNTER — Ambulatory Visit: Payer: BC Managed Care – PPO | Admitting: Physical Therapy

## 2019-06-28 ENCOUNTER — Ambulatory Visit: Payer: BC Managed Care – PPO | Attending: Internal Medicine | Admitting: Physical Therapy

## 2019-06-28 ENCOUNTER — Encounter: Payer: Self-pay | Admitting: Physical Therapy

## 2019-06-28 ENCOUNTER — Other Ambulatory Visit: Payer: Self-pay

## 2019-06-28 DIAGNOSIS — M6281 Muscle weakness (generalized): Secondary | ICD-10-CM

## 2019-06-28 DIAGNOSIS — R252 Cramp and spasm: Secondary | ICD-10-CM

## 2019-06-28 DIAGNOSIS — M79622 Pain in left upper arm: Secondary | ICD-10-CM | POA: Diagnosis present

## 2019-06-28 DIAGNOSIS — M542 Cervicalgia: Secondary | ICD-10-CM | POA: Diagnosis not present

## 2019-06-28 DIAGNOSIS — R209 Unspecified disturbances of skin sensation: Secondary | ICD-10-CM | POA: Diagnosis present

## 2019-06-28 DIAGNOSIS — R208 Other disturbances of skin sensation: Secondary | ICD-10-CM

## 2019-06-28 NOTE — Therapy (Addendum)
[place], [place], [address] Phone: [phone]   Fax:  [phone]  Physical Therapy Evaluation/Discharge  Patient Details  Name: Michaela Carr MRN: [id number] Date of Birth: [birth year] Referring Provider (PT): Billey Gosling, MD    Encounter Date: 06/28/2019  PT End of Session - 06/28/19 1340    Visit Number  1    Number of Visits  12    Date for PT Re-Evaluation  08/23/19   allow 2 weeks for scheduling   Authorization Type  BCBS    PT Start Time  1209    PT Stop Time  1255    PT Time Calculation (min)  46 min    Activity Tolerance  Patient tolerated treatment well    Behavior During Therapy  [facility] for tasks assessed/performed       Past Medical History:  Diagnosis Date  . STD (sexually transmitted disease)    trichomonas    Past Surgical History:  Procedure Laterality Date  . BREAST SURGERY  1997   breast reduction  . LAPAROSCOPIC GASTRIC BANDING  2010   done by [facility]    There were no vitals filed for this visit.   Subjective Assessment - 06/28/19 1214    Subjective  Pt presents to PT for L sided arm pain with radiating symptoms into hand.  Her pain is improving overall but she stil has difficulty carrying items, lifting with her LUE.  Her fingers are numb (digits-3) and pain down her arm limits her functional activity.  She avoids using her arm when able especially at work.    Pertinent History  obesity, sciatica    Limitations  Lifting;Standing;House hold activities    Diagnostic tests  CT scan doneDegenerative changes from C3-C4through C6-C7 as described.3. Moderate left and mild right neural foraminal narrowing from uncovertebral spurring at C6-C7.    Patient Stated Goals  stop this pain in my arm    Currently in Pain?  Yes    Pain Score  5     Pain Location  Shoulder    Pain Orientation  Left    Pain Descriptors / Indicators  Aching;Tightness;Tingling;Numbness;Sore     Pain Type  Chronic pain;Acute pain    Pain Radiating Towards  L arm to fingers    Pain Onset  More than a month ago    Pain Frequency  Intermittent    Aggravating Factors   using her arm    Pain Relieving Factors  massage helps soaking in Epsom salt, min help with medication    Effect of Pain on Daily Activities  wants to continue to be active , exercise         [facility] PT Assessment - 06/28/19 0001      Assessment   Medical Diagnosis  L upper back pain     Referring Provider (PT)  Billey Gosling, MD     Onset Date/Surgical Date  --   2 weeks ago    Hand Dominance  Right    Prior Therapy  Yes, for sciatica       Precautions   Precautions  None      Restrictions   Weight Bearing Restrictions  No      Balance Screen   Has the patient fallen in the past 6 months  No      [place] residence    Living Arrangements  Spouse/significant other;Children  Prior Function   Level of Independence  Independent    Vocation  Full time employment    Leisure  gym, traveling ,friends       Cognition   Overall Cognitive Status  Within Functional Limits for tasks assessed      Observation/Other Assessments   Focus on Therapeutic Outcomes (FOTO)   46%      Sensation   Light Touch  Impaired by gross assessment    Additional Comments  L digits 2-3 numb intermittently      Coordination   Gross Motor Movements are Fluid and Coordinated  Not tested      Posture/Postural Control   Posture/Postural Control  Postural limitations    Postural Limitations  Rounded Shoulders;Forward head;Increased thoracic kyphosis      AROM   Overall AROM Comments  WFL UEs pain end range flexion LUE     Cervical Flexion  45    Cervical Extension  20    Cervical - Right Side Bend  55    Cervical - Left Side Bend  55    Cervical - Right Rotation  70 pain L     Cervical - Left Rotation  60 pain L       Strength   Overall Strength Comments  Rt grip 63 lbs, Lt 41 lbs      Right Shoulder Flexion  4/5    Right Shoulder ABduction  4/5    Left Shoulder Flexion  4/5    Left Shoulder ABduction  4/5      Palpation   Palpation comment  L upper trap painful with palpation, tightness present and into periscapular muscles , min pain in L lateral cervicals       Special Tests   Other special tests  defer Spurling but distraction did not change symptoms        Objective measurements completed on examination: See above findings.     PT Education - 06/28/19 1340    Education Details  PT/POC, HEP, anatomy, referred pain, posture, stabilization    Person(s) Educated  Patient    Methods  Explanation;Demonstration;Tactile cues;Handout    Comprehension  Verbalized understanding;Returned demonstration;Need further instruction          PT Long Term Goals - 06/28/19 1342      PT LONG TERM GOAL #1   Title  Pt will be I with HEP for neck pain, posture and strength    Time  8    Period  Weeks    Status  New    Target Date  08/23/19      PT LONG TERM GOAL #2   Title  Pt will be able to sleep without interruption of pain in neck, LUE    Time  8    Period  Weeks    Status  New    Target Date  08/23/19      PT LONG TERM GOAL #3   Title  Pt will increase L grip to within 10 lbs of Rt hand (dominant)    Baseline  Rt 63 , Lt 41 lbs    Time  8    Period  Weeks    Status  New    Target Date  08/23/19      PT LONG TERM GOAL #4   Title  Pt will be able to show cervical AROM to normal and without pain in LUE    Time  8    Period  Weeks  Status  New    Target Date  08/23/19      PT LONG TERM GOAL #5   Title  Pt will be able to perforn upper body exercises at the gym without increasing L UE symptoms    Time  8    Period  Weeks    Status  New    Target Date  08/23/19             Plan - 06/28/19 1345    Clinical Impression Statement  Pt presents for low complexity eval of L sided neck and arm pain which is consistent with CT scan findings of DDD  and facet arthropathy.  She presents with decreased grip strength, poor posture, sensory disturbance and pain throughout L upper back, neck and L arm.  She has been unable to perform her job as well as find comfort with sleep/positions. She is improving overall and since her symptoms have already to improve, I expect her to get a full recovery in function of L UE.    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  Modalities for pain, UBE    PT Home Exercise Plan  chin tuck, horixontal abd, upper trap and levator scap stretching    Consulted and Agree with Plan of Care  Patient       Patient will benefit from skilled therapeutic intervention in order to improve the following deficits and impairments:  Increased fascial restricitons, Impaired sensation, Pain, Postural dysfunction, Decreased mobility, Increased muscle spasms, Impaired UE functional use, Impaired flexibility, Decreased range of motion, Decreased strength  Visit Diagnosis: Cervicalgia  Other disturbances of skin sensation  Muscle weakness (generalized)  Pain in left upper arm  Cramp and spasm     Problem List Patient  Active Problem List   Diagnosis Date Noted  . Upper back pain on left side 05/23/2019  . Upper respiratory tract infection 05/05/2018  . Elevated blood pressure reading 07/12/2017  . Right foot pain 03/04/2017  . Prediabetes 03/12/2016  . Tingling 03/09/2016  . Sciatica of left side without back pain 11/12/2015  . Tobacco use disorder 11/12/2015  . Allergic rhinitis 11/12/2015  . Bariatric surgery status 10/07/2009  . OBESITY, MORBID 02/23/2007    Lakeyta Vandenheuvel 06/28/2019, 1:55 PM  [facility] [address] [place], [place], [address] Phone: [phone]   Fax:  [phone]  Name: SAIDY ORMAND MRN: [id number] Date of Birth: [birth year]  Raeford Razor, PT 06/28/19 1:55 PM Phone: [phone] Fax: [phone]   PHYSICAL THERAPY DISCHARGE SUMMARY  Visits from Start of Care: 1  Current functional level related to goals / functional outcomes: unknown   Remaining deficits: See above   Education / Equipment: On eval  Plan: Patient agrees to discharge.  Patient goals were not met. Patient is being discharged due to not returning since the last visit.  ?????  Unable too come at scheduled times due to work schedule.  Did not call back.  Raeford Razor, PT 08/28/19 8:20 AM Phone: [phone] Fax: [phone]

## 2019-06-28 NOTE — Patient Instructions (Signed)
Access Code: [email or web address]  URL: [email or web address]  Date: 06/28/2019  Prepared by: Karie Mainland   Exercises  Supine Chin Tuck - 10 reps - 2 sets - 10 hold - 2x daily - 7x weekly  Supine Shoulder Horizontal Abduction with Resistance - 10 reps - 2 sets - 5 hold - 2x daily - 7x weekly  Seated Upper Trapezius Stretch - 3 reps - 1 sets - 30 hold - 2x daily - 7x weekly  Seated Levator Scapulae Stretch - 3 reps - 1 sets - 30 hold - 2x daily - 7x weekly

## 2019-07-12 ENCOUNTER — Ambulatory Visit: Payer: BC Managed Care – PPO | Admitting: Physical Therapy

## 2019-07-17 ENCOUNTER — Ambulatory Visit: Payer: BC Managed Care – PPO | Attending: Internal Medicine | Admitting: Physical Therapy

## 2019-07-17 ENCOUNTER — Telehealth: Payer: Self-pay | Admitting: Physical Therapy

## 2019-07-17 NOTE — Telephone Encounter (Signed)
Called pt due to missing her 1:30 scheduled appointment today. She noted she was not aware of an appointment today. Reviewed when her next scheduled appointments were and she stated she just got moved to 1st shift today and is now not able to make those appointment times. She requested to go ahead and cancel those appointments and will call back to schedule at a time that works with her schedule.   Forest Pruden PT, DPT, LAT, ATC  07/17/19  5:55 PM

## 2019-07-19 ENCOUNTER — Ambulatory Visit: Payer: BC Managed Care – PPO | Admitting: Physical Therapy

## 2019-07-25 ENCOUNTER — Ambulatory Visit: Payer: BC Managed Care – PPO | Admitting: Physical Therapy

## 2019-07-27 ENCOUNTER — Ambulatory Visit: Payer: BC Managed Care – PPO | Admitting: Physical Therapy

## 2019-07-31 ENCOUNTER — Ambulatory Visit: Payer: BC Managed Care – PPO | Admitting: Physical Therapy

## 2019-08-02 ENCOUNTER — Ambulatory Visit: Payer: BC Managed Care – PPO | Admitting: Physical Therapy

## 2020-05-03 ENCOUNTER — Encounter (HOSPITAL_COMMUNITY): Payer: Self-pay

## 2020-05-03 ENCOUNTER — Emergency Department (HOSPITAL_COMMUNITY): Payer: BC Managed Care – PPO

## 2020-05-03 ENCOUNTER — Emergency Department (HOSPITAL_COMMUNITY)
Admission: EM | Admit: 2020-05-03 | Discharge: 2020-05-03 | Disposition: A | Discharge status: Home / Self Care | Payer: BC Managed Care – PPO | Attending: Emergency Medicine | Admitting: Emergency Medicine

## 2020-05-03 ENCOUNTER — Other Ambulatory Visit: Payer: Self-pay

## 2020-05-03 DIAGNOSIS — R072 Precordial pain: Secondary | ICD-10-CM | POA: Diagnosis not present

## 2020-05-03 DIAGNOSIS — R109 Unspecified abdominal pain: Secondary | ICD-10-CM | POA: Diagnosis not present

## 2020-05-03 DIAGNOSIS — M549 Dorsalgia, unspecified: Secondary | ICD-10-CM | POA: Diagnosis not present

## 2020-05-03 DIAGNOSIS — R091 Pleurisy: Secondary | ICD-10-CM

## 2020-05-03 DIAGNOSIS — F17210 Nicotine dependence, cigarettes, uncomplicated: Secondary | ICD-10-CM | POA: Insufficient documentation

## 2020-05-03 LAB — BASIC METABOLIC PANEL
Anion gap: 9 (ref 5–15)
BUN: 19 mg/dL (ref 6–20)
CO2: 26 mmol/L (ref 22–32)
Calcium: 8.9 mg/dL (ref 8.9–10.3)
Chloride: 104 mmol/L (ref 98–111)
Creatinine, Ser: 0.74 mg/dL (ref 0.44–1.00)
GFR, Estimated: >60 mL/min (ref >60)
Glucose, Bld: 94 mg/dL (ref 70–99)
Potassium: 4 mmol/L (ref 3.5–5.1)
Sodium: 139 mmol/L (ref 135–145)

## 2020-05-03 LAB — CBC WITH DIFFERENTIAL/PLATELET
Abs Immature Granulocytes: 0.01 10*3/uL (ref 0.00–0.07)
Basophils Absolute: 0.1 10*3/uL (ref 0.0–0.1)
Basophils Relative: 1 %
Eosinophils Absolute: 0.2 10*3/uL (ref 0.0–0.5)
Eosinophils Relative: 3 %
HCT: 43.1 % (ref 36.0–46.0)
Hemoglobin: 12.9 g/dL (ref 12.0–15.0)
Immature Granulocytes: 0 %
Lymphocytes Relative: 36 %
Lymphs Abs: 2.6 10*3/uL (ref 0.7–4.0)
MCH: 21.1 pg — ABNORMAL LOW (ref 26.0–34.0)
MCHC: 29.9 g/dL — ABNORMAL LOW (ref 30.0–36.0)
MCV: 70.7 fL — ABNORMAL LOW (ref 80.0–100.0)
Monocytes Absolute: 0.6 10*3/uL (ref 0.1–1.0)
Monocytes Relative: 9 %
Neutro Abs: 3.7 10*3/uL (ref 1.7–7.7)
Neutrophils Relative %: 51 %
Platelets: 331 10*3/uL (ref 150–400)
RBC: 6.1 MIL/uL — ABNORMAL HIGH (ref 3.87–5.11)
RDW: 17.7 % — ABNORMAL HIGH (ref 11.5–15.5)
WBC: 7.1 10*3/uL (ref 4.0–10.5)
nRBC: 0 % (ref 0.0–0.2)

## 2020-05-03 LAB — I-STAT BETA HCG BLOOD, ED (MC, WL, AP ONLY): I-stat hCG, quantitative: <5 m[IU]/mL (ref <5)

## 2020-05-03 LAB — D-DIMER, QUANTITATIVE: D-Dimer, Quant: 0.78 ug/mL-FEU — ABNORMAL HIGH (ref 0.00–0.50)

## 2020-05-03 MED ORDER — IOHEXOL 350 MG/ML SOLN
75.0000 mL | Freq: Once | INTRAVENOUS | Status: AC | PRN
  Administered 2020-05-03: 75 mL via INTRAVENOUS

## 2020-05-03 MED ORDER — MORPHINE SULFATE (PF) 4 MG/ML IV SOLN
4.0000 mg | Freq: Once | INTRAVENOUS | Status: AC
  Administered 2020-05-03: 4 mg via INTRAVENOUS
  Filled 2020-05-03: qty 1

## 2020-05-03 MED ORDER — KETOROLAC TROMETHAMINE 30 MG/ML IJ SOLN
15.0000 mg | Freq: Once | INTRAMUSCULAR | Status: AC
  Administered 2020-05-03: 15 mg via INTRAVENOUS
  Filled 2020-05-03: qty 1

## 2020-05-03 MED ORDER — IBUPROFEN 600 MG PO TABS: 600.0000 mg | ORAL_TABLET | Freq: Three times a day (TID) | ORAL | 0 refills | Status: DC

## 2020-05-03 MED ORDER — SODIUM CHLORIDE (PF) 0.9 % IJ SOLN
INTRAMUSCULAR | Status: AC
  Filled 2020-05-03: qty 50

## 2020-05-03 NOTE — ED Provider Notes (Signed)
[facility]-EMERGENCY DEPT Provider Note   CSN: [id number] Arrival date & time: 05/03/20  0801     History Chief Complaint  Patient presents with  . Chest Pain  . Back Pain    Michaela Carr is a 49 y.o. female.  HPI     49 year old female comes in a chief complaint of chest pain and back pain.  She reports that over the last 2 days she has been having pain over her right flank region moving to the right breast and right scapular region.  The pain is worse with deep inspiration.  The pain has been constant.  She denies any cough, URI-like symptoms. Pt has no hx of PE, DVT and denies any exogenous hormone (testosterone / estrogen) use, long distance travels or surgery in the past 6 weeks, active cancer, recent immobilization.   Past Medical History:  Diagnosis Date  . STD (sexually transmitted disease)    trichomonas    Patient Active Problem List   Diagnosis Date Noted  . Upper back pain on left side 05/23/2019  . Upper respiratory tract infection 05/05/2018  . Elevated blood pressure reading 07/12/2017  . Right foot pain 03/04/2017  . Prediabetes 03/12/2016  . Tingling 03/09/2016  . Sciatica of left side without back pain 11/12/2015  . Tobacco use disorder 11/12/2015  . Allergic rhinitis 11/12/2015  . Bariatric surgery status 10/07/2009  . OBESITY, MORBID 02/23/2007    Past Surgical History:  Procedure Laterality Date  . ABLATION    . BREAST SURGERY  1997   breast reduction  . LAPAROSCOPIC GASTRIC BANDING  2010   done by [facility]     OB History    Gravida  1   Para      Term      Preterm      AB  1   Living        SAB  1   TAB      Ectopic      Multiple      Live Births              Family History  Problem Relation Age of Onset  . Hypertension Father   . Diabetes Maternal Grandmother     Social History   Tobacco Use  . Smoking status: Current Every Day Smoker    Packs/day: 0.50    Years:  30.00    Pack years: 15.00    Types: Cigarettes    Last attempt to quit: 05/08/2017    Years since quitting: 2.9  . Smokeless tobacco: Never Used  Vaping Use  . Vaping Use: Never used  Substance Use Topics  . Alcohol use: Yes    Alcohol/week: 0.0 standard drinks    Comment: social  . Drug use: No    Home Medications Prior to Admission medications   Medication Sig Start Date End Date Taking? Authorizing Provider  APPLE CIDER VINEGAR PO Take 2 tablets by mouth daily.   Yes [provider]  fluticasone (FLONASE) 50 MCG/ACT nasal spray Place 2 sprays into both nostrils daily. Patient taking differently: Place 1-2 sprays into both nostrils daily as needed for allergies.  05/23/19  Yes Burns, Bobette Mo, MD  vitamin B-12 (CYANOCOBALAMIN) 1000 MCG tablet Take 1,000 mcg by mouth daily.   Yes [provider]  methocarbamol (ROBAXIN) 500 MG tablet Take 1 tablet (500 mg total) by mouth every 8 (eight) hours as needed. Patient not taking: Reported on 05/03/2020 05/21/19  Petrucelli, Samantha R, PA-C  predniSONE (STERAPRED UNI-PAK 21 TAB) 10 MG (21) TBPK tablet Take by mouth daily. 6, 5, 4, 3, 2, 1 Patient not taking: Reported on 05/03/2020 05/21/19   Petrucelli, Lelon Mast R, PA-C  Vitamin D, Ergocalciferol, (DRISDOL) 1.25 MG (50000 UNIT) CAPS capsule Take by mouth. Patient not taking: Reported on 05/03/2020 12/21/19   [provider]    Allergies    Patient has no known allergies.  Review of Systems   Review of Systems  Constitutional: Positive for activity change.  Respiratory: Negative for shortness of breath.   Cardiovascular: Positive for chest pain.  Gastrointestinal: Negative for abdominal pain, nausea and vomiting.  Genitourinary: Positive for flank pain.  All other systems reviewed and are negative.   Physical Exam Updated Vital Signs BP (!) 154/82   Pulse 68   Temp 98.1 F (36.7 C) (Oral)   Resp 14   Ht 5\' 7"  (1.702 m)   Wt 129.3 kg   SpO2 93%    BMI 44.64 kg/m   Physical Exam Vitals and nursing note reviewed.  Constitutional:      Appearance: She is well-developed.  HENT:     Head: Normocephalic and atraumatic.  Cardiovascular:     Rate and Rhythm: Normal rate.  Pulmonary:     Effort: Pulmonary effort is normal.  Abdominal:     General: Bowel sounds are normal.  Musculoskeletal:     Cervical back: Normal range of motion and neck supple.     Right lower leg: No edema.     Left lower leg: No edema.  Skin:    General: Skin is warm and dry.  Neurological:     Mental Status: She is alert and oriented to person, place, and time.     ED Results / Procedures / Treatments   Labs (all labs ordered are listed, but only abnormal results are displayed) Labs Reviewed  CBC WITH DIFFERENTIAL/PLATELET - Abnormal; Notable for the following components:      Result Value   RBC 6.10 (*)    MCV 70.7 (*)    MCH 21.1 (*)    MCHC 29.9 (*)    RDW 17.7 (*)    All other components within normal limits  D-DIMER, QUANTITATIVE (NOT AT [facility]) - Abnormal; Notable for the following components:   D-Dimer, Quant 0.78 (*)    All other components within normal limits  BASIC METABOLIC PANEL  I-STAT BETA HCG BLOOD, ED ([facility], [facility], [facility] ONLY)    EKG EKG Interpretation  Date/Time:  Friday May 03 2020 12:52:38 EST Ventricular Rate:  65 PR Interval:    QRS Duration: 83 QT Interval:  420 QTC Calculation: 437 R Axis:   81 Text Interpretation: Sinus rhythm Low voltage, precordial leads No acute changes No significant change since last tracing Confirmed by [birth year]1981 [id number]) on 05/03/2020 1:16:31 PM   Radiology CT Angio Chest PE W and/or Wo Contrast  Result Date: 05/03/2020 CLINICAL DATA:  Chest and back pain EXAM: CT ANGIOGRAPHY CHEST WITH CONTRAST TECHNIQUE: Multidetector CT imaging of the chest was performed using the standard protocol during bolus administration of intravenous contrast. Multiplanar CT image reconstructions and MIPs were  obtained to evaluate the vascular anatomy. CONTRAST:  60mL OMNIPAQUE IOHEXOL 350 MG/ML SOLN COMPARISON:  None. FINDINGS: Cardiovascular: Satisfactory opacification of the pulmonary arteries only to the proximal segmental level due to contrast bolus timing and respiratory motion artifact. No evidence of pulmonary embolism. Mild cardiomegaly. No pericardial effusion. Mediastinum/Nodes: Visualized thyroid is unremarkable.  No enlarged lymph nodes. Lungs/Pleura: No pleural effusion or pneumothorax. No consolidation or mass. Upper Abdomen: Gastric lap band. Musculoskeletal: No acute osseous abnormality. Review of the MIP images confirms the above findings. IMPRESSION: No evidence of acute pulmonary embolism. Mild cardiomegaly. Electronically Signed   By: Guadlupe Spanish M.D.   On: 05/03/2020 12:42   DG Chest Port 1 View  Result Date: 05/03/2020 CLINICAL DATA:  Intermittent ribcage and mid back pain for the past 5 days. EXAM: PORTABLE CHEST 1 VIEW COMPARISON:  Chest x-ray dated June 04, 2015. FINDINGS: The heart size and mediastinal contours are within normal limits. Both lungs are clear. The visualized skeletal structures are unremarkable. IMPRESSION: No active disease. Electronically Signed   By: Obie Dredge M.D.   On: 05/03/2020 09:24    Procedures Procedures (including critical care time)  Medications Ordered in ED Medications  sodium chloride (PF) 0.9 % injection (has no administration in time range)  morphine 4 MG/ML injection 4 mg (4 mg Intravenous Given 05/03/20 0929)  ketorolac (TORADOL) 30 MG/ML injection 15 mg (15 mg Intravenous Given 05/03/20 0930)  iohexol (OMNIPAQUE) 350 MG/ML injection 75 mL (75 mLs Intravenous Contrast Given 05/03/20 1208)    ED Course  I have reviewed the triage vital signs and the nursing notes.  Pertinent labs & imaging results that were available during my care of the patient were reviewed by me and considered in my medical decision making (see chart for  details).    MDM Rules/Calculators/A&P                          Patient has a chief complaint of chest pain.  Patient is having pleuritic chest pain.  No abdominal complaints or concerns.  No GU concerns.  Her PERC score is negative.  However she reports severe pleuritic pain.  We considered pleurisy, PE on the differential.  No evidence of trauma.  Symptoms not consistent with esophagitis, pericarditis.  We have agreed to get D-dimer.  If D-dimer is positive she will get a CT PE.  If CT PE is negative then she will follow-up with PCP.  Final Clinical Impression(s) / ED Diagnoses Final diagnoses:  Precordial chest pain    Rx / DC Orders ED Discharge Orders    None       Derwood Kaplan, MD 05/03/20 1319

## 2020-05-03 NOTE — Discharge Instructions (Signed)
We suspect that your symptoms are because of pleurisy.  Take the anti-inflammatory medication prescribed and follow-up with your doctor in 2 weeks if the pain continues.

## 2020-05-03 NOTE — ED Triage Notes (Signed)
Patient reports intermittent rib cage pain and mid back pain x 5 days. Patient states her back and chest pain are worse when she lays flat turns to the right side and when taking a deep breath.

## 2020-05-06 ENCOUNTER — Encounter: Payer: Self-pay | Admitting: Internal Medicine

## 2020-05-06 ENCOUNTER — Ambulatory Visit (INDEPENDENT_AMBULATORY_CARE_PROVIDER_SITE_OTHER): Payer: BC Managed Care – PPO | Admitting: Internal Medicine

## 2020-05-06 VITALS — BP 148/84 | HR 81 | Temp 98.2°F | Ht 67.0 in | Wt 299.0 lb

## 2020-05-06 DIAGNOSIS — M545 Low back pain, unspecified: Secondary | ICD-10-CM | POA: Diagnosis not present

## 2020-05-06 DIAGNOSIS — M549 Dorsalgia, unspecified: Secondary | ICD-10-CM | POA: Diagnosis not present

## 2020-05-06 DIAGNOSIS — M546 Pain in thoracic spine: Secondary | ICD-10-CM | POA: Diagnosis not present

## 2020-05-06 MED ORDER — CYCLOBENZAPRINE HCL 10 MG PO TABS: 10.0000 mg | ORAL_TABLET | Freq: Every day | ORAL | 0 refills | Status: DC

## 2020-05-06 MED ORDER — KETOROLAC TROMETHAMINE 60 MG/2ML IM SOLN
60.0000 mg | Freq: Once | INTRAMUSCULAR | Status: AC
  Administered 2020-05-06: 60 mg via INTRAMUSCULAR

## 2020-05-06 NOTE — Patient Instructions (Addendum)
You received a Toradol injection here today.    Stop taking the advil and ibuprofen since it is upsetting your stomach.    Start taking a muscle relaxer ( flexeril) at night.     Keep doing ice, heat, icy-hot if it helps.

## 2020-05-06 NOTE — Progress Notes (Signed)
Subjective:    Patient ID: Michaela Carr, female    DOB: November 26, 1970, 49 y.o.   MRN: [id number]  HPI The patient is here for follow up from the ED.   ED 11/26 -for chest pain and back pain.  Her pain started 2 days ago and was located on the right side from her right breast to the right scapular region.  The pain was worse with deep breaths and was constant.  She denied any cough or cold symptoms.  She denied N/T.  She denied long distance travel, recent immobilization and history of DVT/PE.  Her blood pressure was elevated.  Her exam was normal.  A chest x-ray and basic blood work was normal.  EKG was normal.  Her D-dimer was mildly elevated.  CT angio of her chest was negative for PE.  He did mention a mild cardiomegaly.  She received, Toradol and her pain improved.  She was diagnosed with precordial chest pain.  She was discharged home with ibuprofen 600 mg.  She is also been taking Advil.  She has tried heat, ice, icy-hot.  Last night she did feel a burning sensation in her upper abdomen that was something new.   She still has the pain.  She has difficulty sleeping due to the pain.  She tried ice.  Her pain is in the back from her scapula to the lower back.  She has occasional pain in her RUQ and under her right breast. The pain is intermittent.  It is worse when she lays down. She can feel it at rest.  Sometimes taking a deep breath can make it worse, but not always. She states if feels like a sprain.  She does some heavy moving at work.      Medications and allergies reviewed with patient and updated if appropriate.  Patient Active Problem List   Diagnosis Date Noted  . Upper back pain on left side 05/23/2019  . Upper respiratory tract infection 05/05/2018  . Elevated blood pressure reading 07/12/2017  . Right foot pain 03/04/2017  . Prediabetes 03/12/2016  . Tingling 03/09/2016  . Sciatica of left side without back pain 11/12/2015  . Tobacco use disorder 11/12/2015  . Allergic  rhinitis 11/12/2015  . Bariatric surgery status 10/07/2009  . OBESITY, MORBID 02/23/2007    Current Outpatient Medications on File Prior to Visit  Medication Sig Dispense Refill  . APPLE CIDER VINEGAR PO Take 2 tablets by mouth daily.    . fluticasone (FLONASE) 50 MCG/ACT nasal spray Place 2 sprays into both nostrils daily. (Patient taking differently: Place 1-2 sprays into both nostrils daily as needed for allergies. ) 16 g 6  . vitamin B-12 (CYANOCOBALAMIN) 1000 MCG tablet Take 1,000 mcg by mouth daily.    . methocarbamol (ROBAXIN) 500 MG tablet Take 1 tablet (500 mg total) by mouth every 8 (eight) hours as needed. (Patient not taking: Reported on 05/03/2020) 15 tablet 0   No current facility-administered medications on file prior to visit.    Past Medical History:  Diagnosis Date  . STD (sexually transmitted disease)    trichomonas    Past Surgical History:  Procedure Laterality Date  . ABLATION    . BREAST SURGERY  1997   breast reduction  . LAPAROSCOPIC GASTRIC BANDING  2010   done by [facility]    Social History   Socioeconomic History  . Marital status: Divorced    Spouse name: Not on file  . Number of children:  Not on file  . Years of education: Not on file  . Highest education level: Not on file  Occupational History  . Not on file  Tobacco Use  . Smoking status: Current Every Day Smoker    Packs/day: 0.50    Years: 30.00    Pack years: 15.00    Types: Cigarettes    Last attempt to quit: 05/08/2017    Years since quitting: 2.9  . Smokeless tobacco: Never Used  Vaping Use  . Vaping Use: Never used  Substance and Sexual Activity  . Alcohol use: Yes    Alcohol/week: 0.0 standard drinks    Comment: social  . Drug use: No  . Sexual activity: Not Currently    Birth control/protection: Condom  Other Topics Concern  . Not on file  Social History Narrative  . Not on file   Social Determinants of Health   Financial Resource Strain:   .  Difficulty of Paying Living Expenses: Not on file  Food Insecurity:   . Worried About Programme researcher, broadcasting/film/video in the Last Year: Not on file  . Ran Out of Food in the Last Year: Not on file  Transportation Needs:   . Lack of Transportation (Medical): Not on file  . Lack of Transportation (Non-Medical): Not on file  Physical Activity:   . Days of Exercise per Week: Not on file  . Minutes of Exercise per Session: Not on file  Stress:   . Feeling of Stress : Not on file  Social Connections:   . Frequency of Communication with Friends and Family: Not on file  . Frequency of Social Gatherings with Friends and Family: Not on file  . Attends Religious Services: Not on file  . Active Member of Clubs or Organizations: Not on file  . Attends Banker Meetings: Not on file  . Marital Status: Not on file    Family History  Problem Relation Age of Onset  . Hypertension Father   . Diabetes Maternal Grandmother     Review of Systems  Constitutional: Negative for chills and fever.  Respiratory: Negative for cough, shortness of breath and wheezing.   Gastrointestinal: Positive for abdominal pain. Negative for blood in stool, constipation, diarrhea and nausea.  Genitourinary: Negative for dysuria and hematuria.       Objective:   Vitals:   05/06/20 1121  BP: (!) 148/84  Pulse: 81  Temp: 98.2 F (36.8 C)  SpO2: 95%   BP Readings from Last 3 Encounters:  05/06/20 (!) 148/84  05/03/20 (!) 154/82  05/23/19 (!) 152/86   Wt Readings from Last 3 Encounters:  05/06/20 299 lb (135.6 kg)  05/03/20 285 lb (129.3 kg)  05/23/19 292 lb (132.5 kg)   Body mass index is 46.83 kg/m.   Physical Exam    Constitutional: Appears well-developed and well-nourished. No distress.  HENT:  Head: Normocephalic and atraumatic.  Cardiovascular: Normal rate, regular rhythm and normal heart sounds.   No murmur heard.   No edema Pulmonary/Chest: No chest wall pain with palpation.  Effort normal  and breath sounds normal. No respiratory distress. No has no wheezes. No rales. Musculoskeletal: No tenderness with palpation thoracic or lumbar spines.  No tenderness with palpation right upper-mid-lower back.  No flank pain with palpation Abdomen: Soft, obese, nontender, nondistended Skin: Skin is warm and dry. Not diaphoretic.  Psychiatric: Normal mood and affect. Behavior is normal.      Assessment & Plan:    See Problem List for  Assessment and Plan of chronic medical problems.    This visit occurred during the SARS-CoV-2 public health emergency.  Safety protocols were in place, including screening questions prior to the visit, additional usage of staff PPE, and extensive cleaning of exam room while observing appropriate contact time as indicated for disinfecting solutions.

## 2020-05-06 NOTE — Assessment & Plan Note (Signed)
Acute Work-up in the ED including EKG, blood work, chest x-ray and CT angio of chest all negative Likely musculoskeletal in nature Toradol 60 mg IM x1 Stop ibuprofen/Advil given stomach upset Flexeril 10 mg at night Continue ice, heat, IcyHot Call if no improvement

## 2020-08-22 ENCOUNTER — Ambulatory Visit (INDEPENDENT_AMBULATORY_CARE_PROVIDER_SITE_OTHER): Payer: BLUE CROSS/BLUE SHIELD | Admitting: Family Medicine

## 2020-09-03 ENCOUNTER — Ambulatory Visit (INDEPENDENT_AMBULATORY_CARE_PROVIDER_SITE_OTHER): Payer: BC Managed Care – PPO | Admitting: Family Medicine

## 2020-09-03 ENCOUNTER — Other Ambulatory Visit: Payer: Self-pay

## 2020-09-03 ENCOUNTER — Ambulatory Visit (INDEPENDENT_AMBULATORY_CARE_PROVIDER_SITE_OTHER): Payer: Self-pay | Admitting: Family Medicine

## 2020-09-03 ENCOUNTER — Encounter (INDEPENDENT_AMBULATORY_CARE_PROVIDER_SITE_OTHER): Payer: Self-pay | Admitting: Family Medicine

## 2020-09-03 VITALS — BP 147/82 | HR 66 | Temp 97.9°F | Ht 67.0 in | Wt 277.0 lb

## 2020-09-03 DIAGNOSIS — E782 Mixed hyperlipidemia: Secondary | ICD-10-CM | POA: Diagnosis not present

## 2020-09-03 DIAGNOSIS — R5383 Other fatigue: Secondary | ICD-10-CM | POA: Diagnosis not present

## 2020-09-03 DIAGNOSIS — E66813 Obesity, class 3: Secondary | ICD-10-CM

## 2020-09-03 DIAGNOSIS — R0602 Shortness of breath: Secondary | ICD-10-CM

## 2020-09-03 DIAGNOSIS — Z9189 Other specified personal risk factors, not elsewhere classified: Secondary | ICD-10-CM

## 2020-09-03 DIAGNOSIS — R7303 Prediabetes: Secondary | ICD-10-CM | POA: Diagnosis not present

## 2020-09-03 DIAGNOSIS — G4733 Obstructive sleep apnea (adult) (pediatric): Secondary | ICD-10-CM | POA: Diagnosis not present

## 2020-09-03 DIAGNOSIS — Z6841 Body Mass Index (BMI) 40.0 and over, adult: Secondary | ICD-10-CM

## 2020-09-03 DIAGNOSIS — Z1331 Encounter for screening for depression: Secondary | ICD-10-CM | POA: Diagnosis not present

## 2020-09-03 DIAGNOSIS — R030 Elevated blood-pressure reading, without diagnosis of hypertension: Secondary | ICD-10-CM

## 2020-09-03 DIAGNOSIS — Z0289 Encounter for other administrative examinations: Secondary | ICD-10-CM

## 2020-09-04 LAB — COMPREHENSIVE METABOLIC PANEL
ALT: 15 IU/L (ref 0–32)
AST: 19 IU/L (ref 0–40)
Albumin/Globulin Ratio: 1.3 (ref 1.2–2.2)
Albumin: 4.5 g/dL (ref 3.8–4.9)
Alkaline Phosphatase: 87 IU/L (ref 44–121)
BUN/Creatinine Ratio: 21 (ref 9–23)
BUN: 16 mg/dL (ref 6–24)
Bilirubin Total: <0.2 mg/dL (ref 0.0–1.2)
CO2: 23 mmol/L (ref 20–29)
Calcium: 9.5 mg/dL (ref 8.7–10.2)
Chloride: 103 mmol/L (ref 96–106)
Creatinine, Ser: 0.76 mg/dL (ref 0.57–1.00)
Globulin, Total: 3.4 g/dL (ref 1.5–4.5)
Glucose: 89 mg/dL (ref 65–99)
Potassium: 4.6 mmol/L (ref 3.5–5.2)
Sodium: 143 mmol/L (ref 134–144)
Total Protein: 7.9 g/dL (ref 6.0–8.5)
eGFR: 95 mL/min/{1.73_m2} (ref >59)

## 2020-09-04 LAB — HEMOGLOBIN A1C
Est. average glucose Bld gHb Est-mCnc: 123 mg/dL
Hgb A1c MFr Bld: 5.9 % — ABNORMAL HIGH (ref 4.8–5.6)

## 2020-09-04 LAB — T3: T3, Total: 110 ng/dL (ref 71–180)

## 2020-09-04 LAB — CBC WITH DIFFERENTIAL/PLATELET
Basophils Absolute: 0.1 10*3/uL (ref 0.0–0.2)
Basos: 1 %
EOS (ABSOLUTE): 0.2 10*3/uL (ref 0.0–0.4)
Eos: 3 %
Hematocrit: 49.3 % — ABNORMAL HIGH (ref 34.0–46.6)
Hemoglobin: 14.8 g/dL (ref 11.1–15.9)
Immature Grans (Abs): 0 10*3/uL (ref 0.0–0.1)
Immature Granulocytes: 0 %
Lymphocytes Absolute: 2.5 10*3/uL (ref 0.7–3.1)
Lymphs: 40 %
MCH: 20.8 pg — ABNORMAL LOW (ref 26.6–33.0)
MCHC: 30 g/dL — ABNORMAL LOW (ref 31.5–35.7)
MCV: 69 fL — ABNORMAL LOW (ref 79–97)
Monocytes Absolute: 0.5 10*3/uL (ref 0.1–0.9)
Monocytes: 8 %
Neutrophils Absolute: 3 10*3/uL (ref 1.4–7.0)
Neutrophils: 48 %
Platelets: 335 10*3/uL (ref 150–450)
RBC: 7.11 x10E6/uL (ref 3.77–5.28)
RDW: 18 % — ABNORMAL HIGH (ref 11.7–15.4)
WBC: 6.2 10*3/uL (ref 3.4–10.8)

## 2020-09-04 LAB — FOLATE: Folate: 4.4 ng/mL (ref >3.0)

## 2020-09-04 LAB — LIPID PANEL WITH LDL/HDL RATIO
Cholesterol, Total: 189 mg/dL (ref 100–199)
HDL: 40 mg/dL (ref >39)
LDL Chol Calc (NIH): 138 mg/dL — ABNORMAL HIGH (ref 0–99)
LDL/HDL Ratio: 3.5 ratio — ABNORMAL HIGH (ref 0.0–3.2)
Triglycerides: 56 mg/dL (ref 0–149)
VLDL Cholesterol Cal: 11 mg/dL (ref 5–40)

## 2020-09-04 LAB — TSH: TSH: 1.15 u[IU]/mL (ref 0.450–4.500)

## 2020-09-04 LAB — VITAMIN D 25 HYDROXY (VIT D DEFICIENCY, FRACTURES): Vit D, 25-Hydroxy: 8 ng/mL — ABNORMAL LOW (ref 30.0–100.0)

## 2020-09-04 LAB — T4, FREE: Free T4: 1.44 ng/dL (ref 0.82–1.77)

## 2020-09-04 LAB — VITAMIN B12: Vitamin B-12: 776 pg/mL (ref 232–1245)

## 2020-09-04 LAB — INSULIN, RANDOM: INSULIN: 9.9 u[IU]/mL (ref 2.6–24.9)

## 2020-09-05 ENCOUNTER — Ambulatory Visit (INDEPENDENT_AMBULATORY_CARE_PROVIDER_SITE_OTHER): Payer: BLUE CROSS/BLUE SHIELD | Admitting: Family Medicine

## 2020-09-10 NOTE — Progress Notes (Signed)
Chief Complaint:   OBESITY Michaela Carr (MR# [id number]) is a 50 y.o. female who presents for evaluation and treatment of obesity and related comorbidities. Current BMI is Body mass index is 43.38 kg/m. Michaela Carr has been struggling with her weight for many years and has been unsuccessful in either losing weight, maintaining weight loss, or reaching her healthy weight goal.  Michaela Carr is currently in the action stage of change and ready to dedicate time achieving and maintaining a healthier weight. Michaela Carr is interested in becoming our patient and working on intensive lifestyle modifications including (but not limited to) diet and exercise for weight loss.  Michaela Carr was referred to the clinic by a friend/coworker. She ha a lap band and does not feel any restrictions. She likes to cook and enjoys pasta (favorite is [employer]). BF- shake in AM (6-6:30AM); 10:45AM-11:30AM Lunch- grilled salad or wrap (chicken wrap-grilled chicken and cheese) or avocado chips (feel full); 1PM- snack sugar free yogurt or mozzarella cheese stick; Dinner- creamed spinach + baked chicken in air fryer.  Michaela Carr's habits were reviewed today and are as follows: Her family eats meals together, her desired weight loss is 47 lbs, she has been heavy most of her life, her heaviest weight ever was 309 pounds, she has significant food cravings issues, she snacks frequently in the evenings, she is frequently drinking liquids with calories, she frequently makes poor food choices, she has problems with excessive hunger, she frequently eats larger portions than normal and she struggles with emotional eating.  Depression Screen Michaela Carr's Food and Mood (modified PHQ-9) score was 5.  Depression screen Michaela Carr 2/9 09/03/2020  Decreased Interest 0  Down, Depressed, Hopeless 0  PHQ - 2 Score 0  Altered sleeping 2  Tired, decreased energy 2  Change in appetite 1  Feeling bad or failure about yourself  0  Trouble concentrating 0  Moving slowly or  fidgety/restless 0  Suicidal thoughts 0  PHQ-9 Score 5  Difficult doing work/chores Not difficult at all   Subjective:   1. Other fatigue [name] admits to daytime somnolence and admits to waking up still tired. Patent has a history of symptoms of morning fatigue and morning headache. [name] generally gets 5 or 6 hours of sleep per night, and states that she has poor sleep quality. Snoring is present. Apneic episodes are present. Epworth Sleepiness Score is 15. EKG normal sinus rhythm at 74 bpm.  2. SOB (shortness of breath) on exertion Michaela Carr notes increasing shortness of breath with exercising and seems to be worsening over time with weight gain. She notes getting out of breath sooner with activity than she used to. This has gotten worse recently. [name] denies shortness of breath at rest or orthopnea. EKG normal sinus rhythm at 74 bpm.  3. OSA (obstructive sleep apnea) Michaela Carr has a CPAP but doesn't wear it due to comfort/sleep position.   4. Pre-diabetes [name]'s last A1c was 6.3. She is not on medication.  5. Mixed hyperlipidemia Michaela Carr's last LDL 98, HDL 42, triglycerides 78. She is not on statin therapy.  6. Elevated BP without diagnosis of hypertension Michaela Carr doesn't have a diagnosis of hypertension. She is in pain today.  Pt denies chest pain, chest pressure and headache.  BP Readings from Last 3 Encounters:  09/03/20 (!) 147/82  05/06/20 (!) 148/84  05/03/20 (!) 154/82    7. At risk for heart disease [name] is at a higher than average risk for cardiovascular disease due to obesity.   Assessment/Plan:  1. Other fatigue Michaela Carr does feel that her weight is causing her energy to be lower than it should be. Fatigue may be related to obesity, depression or many other causes. Labs will be ordered, and in the meanwhile, [name] will focus on self care including making healthy food choices, increasing physical activity and focusing on stress reduction. Check labs today.  - EKG 12-Lead -  Folate - T3 - T4, free - TSH - Vitamin B12 - VITAMIN D 25 Hydroxy (Vit-D Deficiency, Fractures)  2. SOB (shortness of breath) on exertion Michaela Carr does feel that she gets out of breath more easily that she used to when she exercises. Michaela Carr's shortness of breath appears to be obesity related and exercise induced. She has agreed to work on weight loss and gradually increase exercise to treat her exercise induced shortness of breath. Will continue to monitor closely. Check labs today.  - CBC with Differential/Platelet  3. OSA (obstructive sleep apnea) Intensive lifestyle modifications are the first line treatment for this issue. We discussed several lifestyle modifications today and she will continue to work on diet, exercise and weight loss efforts. We will continue to monitor. Orders and follow up as documented in patient record. Follow up at next appointment.  4. Pre-diabetes Michaela Carr will continue to work on weight loss, exercise, and decreasing simple carbohydrates to help decrease the risk of diabetes. Check labs today.  - Hemoglobin A1c - Insulin, random  5. Mixed hyperlipidemia Cardiovascular risk and specific lipid/LDL goals reviewed.  We discussed several lifestyle modifications today and Michaela Carr will continue to work on diet, exercise and weight loss efforts. Orders and follow up as documented in patient record. Check labs today.  Counseling Intensive lifestyle modifications are the first line treatment for this issue. . Dietary changes: Increase soluble fiber. Decrease simple carbohydrates. . Exercise changes: Moderate to vigorous-intensity aerobic activity 150 minutes per week if tolerated. . Lipid-lowering medications: see documented in medical record.  - Lipid Panel With LDL/HDL Ratio  6. Elevated BP without diagnosis of hypertension Michaela Carr is working on healthy weight loss and exercise to improve blood pressure control. We will watch for signs of hypotension as she continues her  lifestyle modifications. Check labs today.  - Comprehensive metabolic panel  7. Depression screening Michaela Carr had a positive depression screening. Depression is commonly associated with obesity and often results in emotional eating behaviors. We will monitor this closely and work on CBT to help improve the non-hunger eating patterns. Referral to Psychology may be required if no improvement is seen as she continues in our clinic.  8. At risk for heart disease Michaela Carr was given approximately 15 minutes of coronary artery disease prevention counseling today. She is 50 y.o. female and has risk factors for heart disease including obesity. We discussed intensive lifestyle modifications today with an emphasis on specific weight loss instructions and strategies.   Repetitive spaced learning was employed today to elicit superior memory formation and behavioral change.  9. Class 3 severe obesity with serious comorbidity and body mass index (BMI) of 40.0 to 44.9 in adult, unspecified obesity type (HCC) Michaela Carr is currently in the action stage of change and her goal is to continue with weight loss efforts. I recommend Michaela Carr begin the structured treatment plan as follows:  She has agreed to the Category 2 Plan + 6 oz.  Exercise goals: No exercise has been prescribed at this time.   Behavioral modification strategies: increasing lean protein intake, meal planning and cooking strategies, keeping healthy foods in the  home and planning for success.  She was informed of the importance of frequent follow-up visits to maximize her success with intensive lifestyle modifications for her multiple health conditions. She was informed we would discuss her lab results at her next visit unless there is a critical issue that needs to be addressed sooner. Michaela Carr agreed to keep her next visit at the agreed upon time to discuss these results.  Objective:   Blood pressure (!) 147/82, pulse 66, temperature 97.9 F (36.6 C),  temperature source Oral, height 5\' 7"  (1.702 m), weight 277 lb (125.6 kg), SpO2 99 %. Body mass index is 43.38 kg/m.  EKG: Normal sinus rhythm, rate 74.  Indirect Calorimeter completed today shows a VO2 of 176 and a REE of 1221.  Her calculated basal metabolic rate is thus her basal metabolic rate is worse than expected.  General: Cooperative, alert, well developed, in no acute distress. HEENT: Conjunctivae and lids unremarkable. Cardiovascular: Regular rhythm.  Lungs: Normal work of breathing. Neurologic: No focal deficits.   Lab Results  Component Value Date   CREATININE 0.76 09/03/2020   BUN 16 09/03/2020   NA 143 09/03/2020   K 4.6 09/03/2020   CL 103 09/03/2020   CO2 23 09/03/2020   Lab Results  Component Value Date   ALT 15 09/03/2020   AST 19 09/03/2020   ALKPHOS 87 09/03/2020   BILITOT <0.2 09/03/2020   Lab Results  Component Value Date   HGBA1C 5.9 (H) 09/03/2020   HGBA1C 6.3 07/12/2017   HGBA1C 6.0 03/09/2016   Lab Results  Component Value Date   INSULIN 9.9 09/03/2020   Lab Results  Component Value Date   TSH 1.150 09/03/2020   Lab Results  Component Value Date   CHOL 189 09/03/2020   HDL 40 09/03/2020   LDLCALC 138 (H) 09/03/2020   TRIG 56 09/03/2020   CHOLHDL 4 07/12/2017   Lab Results  Component Value Date   WBC 6.2 09/03/2020   HGB 14.8 09/03/2020   HCT 49.3 (H) 09/03/2020   MCV 69 (L) 09/03/2020   PLT 335 09/03/2020    Attestation Statements:   Reviewed by clinician on day of visit: allergies, medications, problem list, medical history, surgical history, family history, social history, and previous encounter notes.  09/05/2020, am acting as transcriptionist for Edmund Hilda, MD.  This is the patient's first visit at [facility]. The patient's NEW PATIENT PACKET was reviewed at length. Included in the packet: current and past health history, medications, allergies, ROS, gynecologic history (women only),  surgical history, family history, social history, weight history, weight loss surgery history (for those that have had weight loss surgery), nutritional evaluation, mood and food questionnaire, PHQ9, Epworth questionnaire, sleep habits questionnaire, patient life and health improvement goals questionnaire. These will all be scanned into the patient's chart under media.   During the visit, I independently reviewed the patient's EKG, bioimpedance scale results, and indirect calorimeter results. I used this information to tailor a meal plan for the patient that will help her to lose weight and will improve her obesity-related conditions going forward. I performed a medically necessary appropriate examination and/or evaluation. I discussed the assessment and treatment plan with the patient. The patient was provided an opportunity to ask questions and all were answered. The patient agreed with the plan and demonstrated an understanding of the instructions. Labs were ordered at this visit and will be reviewed at the next visit unless more critical results need to  be addressed immediately. Clinical information was updated and documented in the EMR.   Time spent on visit including pre-visit chart review and post-visit care was 45 minutes.   A separate 15 minutes was spent on risk counseling (see above).   I have reviewed the above documentation for accuracy and completeness, and I agree with the above. - Katherina MiresAlexandria Awa Bachicha Kadolph, MD

## 2020-09-17 ENCOUNTER — Ambulatory Visit (INDEPENDENT_AMBULATORY_CARE_PROVIDER_SITE_OTHER): Payer: Self-pay | Admitting: Family Medicine

## 2020-09-20 DIAGNOSIS — M5459 Other low back pain: Secondary | ICD-10-CM | POA: Diagnosis not present

## 2020-11-12 DIAGNOSIS — Z20822 Contact with and (suspected) exposure to covid-19: Secondary | ICD-10-CM | POA: Diagnosis not present

## 2020-11-28 ENCOUNTER — Telehealth: Payer: Self-pay | Admitting: Internal Medicine

## 2020-11-28 DIAGNOSIS — Z1211 Encounter for screening for malignant neoplasm of colon: Secondary | ICD-10-CM

## 2020-11-28 NOTE — Telephone Encounter (Signed)
Patient requesting referral to GI for screening colonoscopy No preference of provider

## 2020-11-29 NOTE — Telephone Encounter (Signed)
Placed referral to GI for colonoscopy.

## 2020-11-29 NOTE — Telephone Encounter (Signed)
Yes, ok to order

## 2021-01-29 DIAGNOSIS — Z1231 Encounter for screening mammogram for malignant neoplasm of breast: Secondary | ICD-10-CM | POA: Diagnosis not present

## 2021-01-29 LAB — HM MAMMOGRAPHY

## 2021-02-13 ENCOUNTER — Encounter: Payer: Self-pay | Admitting: Internal Medicine

## 2021-02-13 NOTE — Progress Notes (Signed)
Outside notes received. Information abstracted. Notes sent to scan.  

## 2021-04-03 DIAGNOSIS — M7551 Bursitis of right shoulder: Secondary | ICD-10-CM | POA: Diagnosis not present

## 2021-04-03 DIAGNOSIS — M25519 Pain in unspecified shoulder: Secondary | ICD-10-CM | POA: Diagnosis not present

## 2021-04-09 ENCOUNTER — Ambulatory Visit: Payer: Self-pay | Admitting: Internal Medicine

## 2021-04-10 NOTE — Progress Notes (Signed)
Subjective:    Patient ID: Michaela Carr, female    DOB: 1971/01/18, 50 y.o.   MRN: [id number]  This visit occurred during the SARS-CoV-2 public health emergency.  Safety protocols were in place, including screening questions prior to the visit, additional usage of staff PPE, and extensive cleaning of exam room while observing appropriate contact time as indicated for disinfecting solutions.    HPI The patient is here for an acute visit.  Right shoulder pain - she went to urgent care - she was diagnosed with bursitis of right shoulder - she had a methylprednisolone 80 mg injection and was prescribed naproxen, flexeril and tramadol.     She woke up with lower arm pain - it was throbbing, sharp pain.  No N/T.  Then the pain was all the was from her neck and shoulder down her arm.  The pain was severe.  The medications took over three days to improve the pain.  She still has some pain in her right lateral upper arm.  Her pain is 3/10. Mild at rest, worse with movement.  Most of the pain is focused at the shoulder.  She denies injury.  She does go to the gym, but did not go that day and has been doing mild exercises.    Medications and allergies reviewed with patient and updated if appropriate.  Patient Active Problem List   Diagnosis Date Noted   Acute right-sided thoracic back pain 05/23/2019   Elevated blood pressure reading 07/12/2017   Right foot pain 03/04/2017   Prediabetes 03/12/2016   Tingling 03/09/2016   Sciatica of left side without back pain 11/12/2015   Tobacco use disorder 11/12/2015   Allergic rhinitis 11/12/2015   Bariatric surgery status 10/07/2009   OBESITY, MORBID 02/23/2007    Current Outpatient Medications on File Prior to Visit  Medication Sig Dispense Refill   cyclobenzaprine (FLEXERIL) 5 MG tablet cyclobenzaprine 5 mg tablet  Take 1 tablet 3 times a day by oral route as directed for 10 days.     naproxen (NAPROSYN) 500 MG tablet naproxen 500 mg tablet   Take 1 tablet twice a day by oral route for 30 days.     traMADol (ULTRAM) 50 MG tablet tramadol 50 mg tablet  Take 1 tablet every 6 hours by oral route as needed for 5 days.     No current facility-administered medications on file prior to visit.    Past Medical History:  Diagnosis Date   Back pain    Joint pain    Lower extremity edema    Sleep apnea    STD (sexually transmitted disease)    trichomonas    Past Surgical History:  Procedure Laterality Date   ABLATION     BREAST SURGERY  1997   breast reduction   LAPAROSCOPIC GASTRIC BANDING  2010   done by [facility]    Social History   Socioeconomic History   Marital status: Divorced    Spouse name: Not on file   Number of children: Not on file   Years of education: Not on file   Highest education level: Not on file  Occupational History   Occupation: terster/inspector  Tobacco Use   Smoking status: Every Day    Packs/day: 0.50    Years: 30.00    Pack years: 15.00    Types: Cigarettes    Last attempt to quit: 05/08/2017    Years since quitting: 3.9   Smokeless tobacco: Never  Vaping  Use   Vaping Use: Never used  Substance and Sexual Activity   Alcohol use: Yes    Alcohol/week: 0.0 standard drinks    Comment: social   Drug use: No   Sexual activity: Not Currently    Birth control/protection: Condom  Other Topics Concern   Not on file  Social History Narrative   Not on file   Social Determinants of Health   Financial Resource Strain: Not on file  Food Insecurity: Not on file  Transportation Needs: Not on file  Physical Activity: Not on file  Stress: Not on file  Social Connections: Not on file    Family History  Problem Relation Age of Onset   Hyperlipidemia Mother    Hypertension Father    Hyperlipidemia Father    Alcoholism Father    Diabetes Maternal Grandmother     Review of Systems     Objective:   Vitals:   04/11/21 1442  BP: 126/78  Pulse: 70  Resp: 18  Temp: 98.1  F (36.7 C)   BP Readings from Last 3 Encounters:  04/11/21 126/78  09/03/20 (!) 147/82  05/06/20 (!) 148/84   Wt Readings from Last 3 Encounters:  04/11/21 245 lb (111.1 kg)  09/03/20 277 lb (125.6 kg)  05/06/20 299 lb (135.6 kg)   Body mass index is 38.37 kg/m.   Physical Exam    A Right Shoulder exam was performed.   SWELLING: none  EFFUSION: no  WARMTH: no warmth  TENDERNESS: no tenderness on throughout shoulder joint  ROM: full ROM with minimal  pain NEUROLOGICAL EXAM: normal sensation and strength  PULSES: normal   MSK: no c-spine pain, no paracervical spine muscle tenderness, FROM of neck    Assessment & Plan:    See Problem List for Assessment and Plan of chronic medical problems.

## 2021-04-11 ENCOUNTER — Ambulatory Visit (INDEPENDENT_AMBULATORY_CARE_PROVIDER_SITE_OTHER): Payer: BC Managed Care – PPO | Admitting: Internal Medicine

## 2021-04-11 ENCOUNTER — Other Ambulatory Visit: Payer: Self-pay

## 2021-04-11 DIAGNOSIS — M25511 Pain in right shoulder: Secondary | ICD-10-CM | POA: Diagnosis not present

## 2021-04-11 NOTE — Patient Instructions (Signed)
    If your shoulder pain does not resolve or worsens again let me know so I can refer you to sports medicine.     Continue the medications from urgent care - take them if needed.

## 2021-04-12 ENCOUNTER — Encounter: Payer: Self-pay | Admitting: Internal Medicine

## 2021-04-12 DIAGNOSIS — M25511 Pain in right shoulder: Secondary | ICD-10-CM | POA: Insufficient documentation

## 2021-04-12 NOTE — Assessment & Plan Note (Signed)
Acute  Improved  Continue flexeril 5 mg tid prn, naproxen 500 mg bid - tramadol prn only for pain  Advised heat, ice Revise activities She will let me know if there is no improvement - will refer to sports medicine

## 2021-08-02 ENCOUNTER — Emergency Department (HOSPITAL_BASED_OUTPATIENT_CLINIC_OR_DEPARTMENT_OTHER)
Admission: EM | Admit: 2021-08-02 | Discharge: 2021-08-02 | Disposition: A | Discharge status: Home / Self Care | Payer: BC Managed Care – PPO | Attending: Emergency Medicine | Admitting: Emergency Medicine

## 2021-08-02 ENCOUNTER — Other Ambulatory Visit: Payer: Self-pay

## 2021-08-02 ENCOUNTER — Encounter (HOSPITAL_BASED_OUTPATIENT_CLINIC_OR_DEPARTMENT_OTHER): Payer: Self-pay

## 2021-08-02 ENCOUNTER — Emergency Department (HOSPITAL_BASED_OUTPATIENT_CLINIC_OR_DEPARTMENT_OTHER): Payer: BC Managed Care – PPO | Admitting: Radiology

## 2021-08-02 ENCOUNTER — Emergency Department (HOSPITAL_BASED_OUTPATIENT_CLINIC_OR_DEPARTMENT_OTHER): Payer: BC Managed Care – PPO

## 2021-08-02 DIAGNOSIS — S8261XA Displaced fracture of lateral malleolus of right fibula, initial encounter for closed fracture: Secondary | ICD-10-CM | POA: Insufficient documentation

## 2021-08-02 DIAGNOSIS — S8251XA Displaced fracture of medial malleolus of right tibia, initial encounter for closed fracture: Secondary | ICD-10-CM | POA: Diagnosis not present

## 2021-08-02 DIAGNOSIS — Y9351 Activity, roller skating (inline) and skateboarding: Secondary | ICD-10-CM | POA: Diagnosis not present

## 2021-08-02 DIAGNOSIS — S82891A Other fracture of right lower leg, initial encounter for closed fracture: Secondary | ICD-10-CM

## 2021-08-02 DIAGNOSIS — M7989 Other specified soft tissue disorders: Secondary | ICD-10-CM | POA: Diagnosis not present

## 2021-08-02 DIAGNOSIS — M25571 Pain in right ankle and joints of right foot: Secondary | ICD-10-CM | POA: Insufficient documentation

## 2021-08-02 DIAGNOSIS — S82832A Other fracture of upper and lower end of left fibula, initial encounter for closed fracture: Secondary | ICD-10-CM | POA: Diagnosis not present

## 2021-08-02 DIAGNOSIS — S99911A Unspecified injury of right ankle, initial encounter: Secondary | ICD-10-CM | POA: Diagnosis not present

## 2021-08-02 DIAGNOSIS — S8291XA Unspecified fracture of right lower leg, initial encounter for closed fracture: Secondary | ICD-10-CM | POA: Diagnosis not present

## 2021-08-02 MED ORDER — HYDROCODONE-ACETAMINOPHEN 5-325 MG PO TABS: 2.0000 | ORAL_TABLET | ORAL | 0 refills | Status: DC | PRN

## 2021-08-02 MED ORDER — IBUPROFEN 800 MG PO TABS
800.0000 mg | ORAL_TABLET | Freq: Once | ORAL | Status: AC
  Administered 2021-08-02: 800 mg via ORAL
  Filled 2021-08-02: qty 1

## 2021-08-02 MED ORDER — OXYCODONE-ACETAMINOPHEN 5-325 MG PO TABS
1.0000 | ORAL_TABLET | Freq: Once | ORAL | Status: DC
  Filled 2021-08-02: qty 1

## 2021-08-02 NOTE — ED Provider Notes (Signed)
[facility]   CSN: [id number] Arrival date & time: 08/02/21  1735     History  Chief Complaint  Patient presents with   Ankle Pain    Right    Michaela Carr is a 51 y.o. female.  Past medical history of obesity.  Patient states that yesterday she was rollerblading when she fell.  She ended up having sniffing ankle pain following this as well as swelling.  She has been nonweightbearing ever since then.  She did not come to the hospital today because she was still experiencing significant pain and swelling.  She denies any numbness.  She is not taking anything for pain.   Ankle Pain     Home Medications Prior to Admission medications   Medication Sig Start Date End Date Taking? Authorizing Provider  cyclobenzaprine (FLEXERIL) 5 MG tablet cyclobenzaprine 5 mg tablet  Take 1 tablet 3 times a day by oral route as directed for 10 days.    [provider]  naproxen (NAPROSYN) 500 MG tablet naproxen 500 mg tablet  Take 1 tablet twice a day by oral route for 30 days.    [provider]  traMADol (ULTRAM) 50 MG tablet tramadol 50 mg tablet  Take 1 tablet every 6 hours by oral route as needed for 5 days.    [provider]      Allergies    Patient has no known allergies.    Review of Systems   Review of Systems  Musculoskeletal:  Positive for arthralgias, gait problem and joint swelling.  All other systems reviewed and are negative.  Physical Exam Updated Vital Signs BP (!) 152/86 (BP Location: Left Arm)    Pulse 73    Temp 99.6 F (37.6 C)    Resp 16    Ht 5\' 7"  (1.702 m)    Wt 108.9 kg    SpO2 100%    BMI 37.59 kg/m  Physical Exam Vitals and nursing note reviewed.  Constitutional:      General: She is not in acute distress.    Appearance: Normal appearance. She is well-developed. She is not ill-appearing, toxic-appearing or diaphoretic.  HENT:     Head: Normocephalic and atraumatic.     Nose: No nasal  deformity.     Mouth/Throat:     Lips: Pink. No lesions.  Eyes:     General: Gaze aligned appropriately. No scleral icterus.       Right eye: No discharge.        Left eye: No discharge.     Conjunctiva/sclera: Conjunctivae normal.     Right eye: Right conjunctiva is not injected. No exudate or hemorrhage.    Left eye: Left conjunctiva is not injected. No exudate or hemorrhage. Pulmonary:     Effort: Pulmonary effort is normal. No respiratory distress.  Musculoskeletal:     Right ankle: Swelling, deformity and ecchymosis present. Tenderness present. Decreased range of motion. Normal pulse.     Right Achilles Tendon: Normal.  Skin:    General: Skin is warm and dry.  Neurological:     Mental Status: She is alert and oriented to person, place, and time.  Psychiatric:        Mood and Affect: Mood normal.        Speech: Speech normal.        Behavior: Behavior normal. Behavior is cooperative.    ED Results / Procedures / Treatments   Labs (all labs ordered are listed,  but only abnormal results are displayed) Labs Reviewed - No data to display  EKG None  Radiology DG Ankle Complete Right  Result Date: 08/02/2021 CLINICAL DATA:  Ankle swelling, pain, fall roller-skating EXAM: RIGHT ANKLE - COMPLETE 3+ VIEW COMPARISON:  None. FINDINGS: There is an oblique fracture through the distal right fibula with minimal displacement. Posterior distal tibial fracture also noted on the lateral view. Mild widening of the ankle mortise medially. Diffuse soft tissue swelling. IMPRESSION: Fracture of the distal fibula and posterior tibia. Widening of the medial ankle mortise. Electronically Signed   By: Rolm Baptise M.D.   On: 08/02/2021 19:19    Procedures Procedures   Medications Ordered in ED Medications  ibuprofen (ADVIL) tablet 800 mg (has no administration in time range)    ED Course/ Medical Decision Making/ A&P                           Medical Decision Making Problems  Addressed: Closed fracture of right ankle, initial encounter: acute illness or injury that poses a threat to life or bodily functions  Amount and/or Complexity of Data Reviewed Radiology: ordered and independent interpretation performed. Decision-making details documented in ED Course.  Risk Prescription drug management.   This is a 51 y.o. female with a pertinent PMH of obesity who presents to the ED with right ankle pain after an injury yesterday  Initial Impression: Obvious deformity noted. Likely underlying fracture.  Plan to obtain x-ray.  Ddx: Achilles Rupture, Calcaneus Fx, Jones Fx, Pseudo-Jones Fx, Lisfranc Injury, Maisonneuve, Sprain, Weber Fibula Fx, Other Fx (Bimalleolar, trimalleolar, talus, etc.)   Additional History:  Additional history obtained from n/a External records from outside source obtained and reviewed including n/a Social Determinants of Health: n/a    I personally reviewed and interpreted all laboratory work and imaging . I agree with radiologist interpretation. Abnormal results outlined below.  X ray ankle:  Fracture of the distal fibula and posterior tibia. Widening of the  medial ankle mortise.  CT ankle:  Oblique fracture noted through the distal fibula with mild  displacement of approximately 6 mm. This extends to the level of the  ankle mortise. Fracture noted through the posterior tibia/malleolus.  Minimal displacement of fracture fragments. There is widening of the  medial and anterior ankle mortise. No medial malleolar fracture.  Posterior and plantar calcaneal spurs.    Medications:   I ordered medication including Advil  for pain Reevaluation of the patient after these medicines showed that the patient improved I have reviewed the patients home medicines and have made adjustments as needed  Critical Interventions: Splint placement for possible unstable fracture  Consultations: I requested consultation with Ortho,  and discussed lab and  imaging findings as well as pertinent plan - they recommend: CT imaging, splint placement, non weight bearing, RICE, f/u with Dr. Linnell Fulling next week. Possibly surgical.   My Impression: Patient has a distal tib/fib fracture. I have called ortho and have obtained CT imaging. Patient has been splinted and given f/u instructions   Disposition:  After consideration of the diagnostic results and the patients response to treatment, I feel that the patent would benefit from ortho f/u outpatient.  I have discussed this patient with my attending physician, Dr. Tamera Punt who has made changes to the plan accordingly.  Portions of this note were generated with [employer]. Dictation errors may occur despite best attempts at proofreading.    Final Clinical Impression(s) / ED  Diagnoses Final diagnoses:  Closed fracture of right ankle, initial encounter    Rx / DC Orders ED Discharge Orders     None         Adolphus Birchwood, PA-C 08/02/21 2204    Malvin Johns, MD 08/02/21 2255

## 2021-08-02 NOTE — ED Triage Notes (Signed)
Pt arrives POV with her husband.  Pt states she fell last pm while roller skating.  Obvious swelling and deformity to right ankle.  NAD noted in triage.

## 2021-08-02 NOTE — Discharge Instructions (Addendum)
You need to follow up with Orthopedics, Dr. Odis Hollingshead this week. This is a foot an ankle specialist.  Call the office on Monday to schedule an appointment.  Stay non weight bearing. Recommend rest, ice, elevation, and compression treatment. Treat pain with Ibuprofen and Tylenol.

## 2021-08-02 NOTE — ED Notes (Signed)
Patient transported to CT 

## 2021-08-06 DIAGNOSIS — S82841A Displaced bimalleolar fracture of right lower leg, initial encounter for closed fracture: Secondary | ICD-10-CM | POA: Insufficient documentation

## 2021-08-07 ENCOUNTER — Encounter (HOSPITAL_COMMUNITY): Payer: Self-pay | Admitting: Orthopaedic Surgery

## 2021-08-07 ENCOUNTER — Other Ambulatory Visit: Payer: Self-pay

## 2021-08-07 DIAGNOSIS — S82841A Displaced bimalleolar fracture of right lower leg, initial encounter for closed fracture: Secondary | ICD-10-CM | POA: Diagnosis not present

## 2021-08-07 DIAGNOSIS — M25571 Pain in right ankle and joints of right foot: Secondary | ICD-10-CM | POA: Diagnosis not present

## 2021-08-07 NOTE — Progress Notes (Signed)
DUE TO COVID-19 ONLY ONE VISITOR IS ALLOWED TO COME WITH YOU AND STAY IN THE WAITING ROOM ONLY DURING PRE OP AND PROCEDURE DAY OF SURGERY.  ? ?PCP - Dr Cheryll Cockayne ?Cardiologist - n/a ? ?Chest x-ray - n/a ?EKG - 09/03/20 ?Stress Test - n/a ?ECHO - n/a ?Cardiac Cath - n/a ? ?ICD Pacemaker/Loop - n/a ? ?Sleep Study -  Yes ?CPAP - Does not use CPAP ? ?ERAS: Clear liquids til 8 am DOS.  ? ?STOP now taking any Aspirin (unless otherwise instructed by your surgeon), Aleve, Naproxen, Ibuprofen, Motrin, Advil, Goody's, BC's, all herbal medications, fish oil, and all vitamins.  ? ?Coronavirus Screening ?Covid test n/a Ambulatory Surgery  ?Do you have any of the following symptoms:  ?Cough yes/no: No ?Fever (>100.69F)  yes/no: No ?Runny nose yes/no: No ?Sore throat yes/no: No ?Difficulty breathing/shortness of breath  yes/no: No ? ?Have you traveled in the last 14 days and where? yes/no: No ? ?Patient verbalized understanding of instructions that were given via phone. ?

## 2021-08-09 ENCOUNTER — Ambulatory Visit (HOSPITAL_COMMUNITY)
Admission: RE | Admit: 2021-08-09 | Discharge: 2021-08-09 | Disposition: A | Discharge status: Home / Self Care | Payer: BC Managed Care – PPO | Attending: Orthopaedic Surgery | Admitting: Orthopaedic Surgery

## 2021-08-09 ENCOUNTER — Ambulatory Visit (HOSPITAL_COMMUNITY): Payer: BC Managed Care – PPO

## 2021-08-09 ENCOUNTER — Encounter (HOSPITAL_COMMUNITY)
Admission: RE | Disposition: A | Discharge status: Home / Self Care | Payer: Self-pay | Source: Home / Self Care | Attending: Orthopaedic Surgery

## 2021-08-09 ENCOUNTER — Ambulatory Visit (HOSPITAL_COMMUNITY): Payer: BC Managed Care – PPO | Admitting: Certified Registered Nurse Anesthetist

## 2021-08-09 ENCOUNTER — Encounter (HOSPITAL_COMMUNITY): Payer: Self-pay | Admitting: Orthopaedic Surgery

## 2021-08-09 ENCOUNTER — Other Ambulatory Visit: Payer: Self-pay

## 2021-08-09 ENCOUNTER — Other Ambulatory Visit (HOSPITAL_COMMUNITY): Payer: Self-pay | Admitting: Orthopaedic Surgery

## 2021-08-09 DIAGNOSIS — S82841A Displaced bimalleolar fracture of right lower leg, initial encounter for closed fracture: Secondary | ICD-10-CM | POA: Insufficient documentation

## 2021-08-09 DIAGNOSIS — X58XXXA Exposure to other specified factors, initial encounter: Secondary | ICD-10-CM | POA: Diagnosis not present

## 2021-08-09 DIAGNOSIS — S93431A Sprain of tibiofibular ligament of right ankle, initial encounter: Secondary | ICD-10-CM | POA: Diagnosis not present

## 2021-08-09 DIAGNOSIS — Z419 Encounter for procedure for purposes other than remedying health state, unspecified: Secondary | ICD-10-CM

## 2021-08-09 DIAGNOSIS — T148XXA Other injury of unspecified body region, initial encounter: Secondary | ICD-10-CM

## 2021-08-09 DIAGNOSIS — G8918 Other acute postprocedural pain: Secondary | ICD-10-CM | POA: Diagnosis not present

## 2021-08-09 DIAGNOSIS — R600 Localized edema: Secondary | ICD-10-CM | POA: Diagnosis not present

## 2021-08-09 DIAGNOSIS — Z01818 Encounter for other preprocedural examination: Secondary | ICD-10-CM

## 2021-08-09 HISTORY — DX: Sciatica, unspecified side: M54.30

## 2021-08-09 HISTORY — PX: ORIF ANKLE FRACTURE: SHX5408

## 2021-08-09 LAB — CBC
HCT: 42.9 % (ref 36.0–46.0)
Hemoglobin: 13.3 g/dL (ref 12.0–15.0)
MCH: 21.9 pg — ABNORMAL LOW (ref 26.0–34.0)
MCHC: 31 g/dL (ref 30.0–36.0)
MCV: 70.6 fL — ABNORMAL LOW (ref 80.0–100.0)
Platelets: 300 10*3/uL (ref 150–400)
RBC: 6.08 MIL/uL — ABNORMAL HIGH (ref 3.87–5.11)
RDW: 16.8 % — ABNORMAL HIGH (ref 11.5–15.5)
WBC: 6.7 10*3/uL (ref 4.0–10.5)
nRBC: 0 % (ref 0.0–0.2)

## 2021-08-09 LAB — SURGICAL PCR SCREEN
MRSA, PCR: NEGATIVE
Staphylococcus aureus: NEGATIVE

## 2021-08-09 LAB — POCT PREGNANCY, URINE: Preg Test, Ur: NEGATIVE

## 2021-08-09 SURGERY — OPEN REDUCTION INTERNAL FIXATION (ORIF) ANKLE FRACTURE
Anesthesia: Regional | Site: Ankle | Laterality: Right

## 2021-08-09 MED ORDER — DEXAMETHASONE SODIUM PHOSPHATE 10 MG/ML IJ SOLN
INTRAMUSCULAR | Status: AC
  Filled 2021-08-09: qty 1

## 2021-08-09 MED ORDER — VANCOMYCIN HCL 500 MG IV SOLR
INTRAVENOUS | Status: AC
  Filled 2021-08-09: qty 10

## 2021-08-09 MED ORDER — PHENYLEPHRINE HCL-NACL 20-0.9 MG/250ML-% IV SOLN
INTRAVENOUS | Status: DC | PRN
  Administered 2021-08-09: 40 ug/min via INTRAVENOUS

## 2021-08-09 MED ORDER — LACTATED RINGERS IV SOLN: INTRAVENOUS | Status: DC

## 2021-08-09 MED ORDER — MIDAZOLAM HCL 2 MG/2ML IJ SOLN
INTRAMUSCULAR | Status: AC
  Administered 2021-08-09: 2 mg via INTRAVENOUS
  Filled 2021-08-09: qty 2

## 2021-08-09 MED ORDER — FENTANYL CITRATE (PF) 100 MCG/2ML IJ SOLN
INTRAMUSCULAR | Status: AC
Start: 2021-08-09 — End: 2021-08-09
  Administered 2021-08-09: 50 ug via INTRAVENOUS
  Filled 2021-08-09: qty 2

## 2021-08-09 MED ORDER — FENTANYL CITRATE (PF) 100 MCG/2ML IJ SOLN: 50.0000 ug | Freq: Once | INTRAMUSCULAR | Status: AC

## 2021-08-09 MED ORDER — LIDOCAINE 2% (20 MG/ML) 5 ML SYRINGE
INTRAMUSCULAR | Status: DC | PRN
Start: 2021-08-09 — End: 2021-08-09
  Administered 2021-08-09: 60 mg via INTRAVENOUS

## 2021-08-09 MED ORDER — FENTANYL CITRATE (PF) 250 MCG/5ML IJ SOLN
INTRAMUSCULAR | Status: AC
  Filled 2021-08-09: qty 5

## 2021-08-09 MED ORDER — ONDANSETRON HCL 4 MG/2ML IJ SOLN
INTRAMUSCULAR | Status: AC
  Filled 2021-08-09: qty 2

## 2021-08-09 MED ORDER — VANCOMYCIN HCL 500 MG IV SOLR
INTRAVENOUS | Status: DC | PRN
  Administered 2021-08-09: 500 mg

## 2021-08-09 MED ORDER — SODIUM CHLORIDE 0.9 % IR SOLN
Status: DC | PRN
  Administered 2021-08-09: 1000 mL

## 2021-08-09 MED ORDER — CHLORHEXIDINE GLUCONATE 0.12 % MT SOLN
OROMUCOSAL | Status: AC
  Administered 2021-08-09: 15 mL via OROMUCOSAL
  Filled 2021-08-09: qty 15

## 2021-08-09 MED ORDER — FENTANYL CITRATE (PF) 100 MCG/2ML IJ SOLN: 25.0000 ug | INTRAMUSCULAR | Status: DC | PRN

## 2021-08-09 MED ORDER — CEFAZOLIN SODIUM-DEXTROSE 2-4 GM/100ML-% IV SOLN
2.0000 g | INTRAVENOUS | Status: AC
  Administered 2021-08-09: 2 g via INTRAVENOUS

## 2021-08-09 MED ORDER — MIDAZOLAM HCL 2 MG/2ML IJ SOLN
INTRAMUSCULAR | Status: DC | PRN
Start: 2021-08-09 — End: 2021-08-09
  Administered 2021-08-09: 2 mg via INTRAVENOUS

## 2021-08-09 MED ORDER — ONDANSETRON HCL 4 MG/2ML IJ SOLN
INTRAMUSCULAR | Status: DC | PRN
Start: 2021-08-09 — End: 2021-08-09
  Administered 2021-08-09: 4 mg via INTRAVENOUS

## 2021-08-09 MED ORDER — EPHEDRINE SULFATE-NACL 50-0.9 MG/10ML-% IV SOSY
PREFILLED_SYRINGE | INTRAVENOUS | Status: DC | PRN
  Administered 2021-08-09 (×4): 5 mg via INTRAVENOUS

## 2021-08-09 MED ORDER — MIDAZOLAM HCL 2 MG/2ML IJ SOLN: 2.0000 mg | Freq: Once | INTRAMUSCULAR | Status: AC

## 2021-08-09 MED ORDER — PHENYLEPHRINE 40 MCG/ML (10ML) SYRINGE FOR IV PUSH (FOR BLOOD PRESSURE SUPPORT)
PREFILLED_SYRINGE | INTRAVENOUS | Status: DC | PRN
Start: 2021-08-09 — End: 2021-08-09
  Administered 2021-08-09 (×2): 120 ug via INTRAVENOUS

## 2021-08-09 MED ORDER — LIDOCAINE 2% (20 MG/ML) 5 ML SYRINGE
INTRAMUSCULAR | Status: AC
  Filled 2021-08-09: qty 5

## 2021-08-09 MED ORDER — PHENYLEPHRINE 40 MCG/ML (10ML) SYRINGE FOR IV PUSH (FOR BLOOD PRESSURE SUPPORT)
PREFILLED_SYRINGE | INTRAVENOUS | Status: AC
  Filled 2021-08-09: qty 20

## 2021-08-09 MED ORDER — MIDAZOLAM HCL 2 MG/2ML IJ SOLN
INTRAMUSCULAR | Status: AC
  Filled 2021-08-09: qty 2

## 2021-08-09 MED ORDER — PROPOFOL 10 MG/ML IV BOLUS
INTRAVENOUS | Status: AC
  Filled 2021-08-09: qty 20

## 2021-08-09 MED ORDER — CEFAZOLIN SODIUM-DEXTROSE 2-4 GM/100ML-% IV SOLN
INTRAVENOUS | Status: AC
  Filled 2021-08-09: qty 100

## 2021-08-09 MED ORDER — DEXAMETHASONE SODIUM PHOSPHATE 10 MG/ML IJ SOLN
INTRAMUSCULAR | Status: DC | PRN
  Administered 2021-08-09: 5 mg via INTRAVENOUS

## 2021-08-09 MED ORDER — FENTANYL CITRATE (PF) 250 MCG/5ML IJ SOLN
INTRAMUSCULAR | Status: DC | PRN
  Administered 2021-08-09 (×3): 25 ug via INTRAVENOUS

## 2021-08-09 MED ORDER — CHLORHEXIDINE GLUCONATE 0.12 % MT SOLN: 15.0000 mL | Freq: Once | OROMUCOSAL | Status: AC

## 2021-08-09 MED ORDER — PROPOFOL 10 MG/ML IV BOLUS
INTRAVENOUS | Status: DC | PRN
  Administered 2021-08-09: 170 mg via INTRAVENOUS

## 2021-08-09 MED ORDER — AMISULPRIDE (ANTIEMETIC) 5 MG/2ML IV SOLN: 10.0000 mg | Freq: Once | INTRAVENOUS | Status: DC | PRN

## 2021-08-09 MED ORDER — ORAL CARE MOUTH RINSE: 15.0000 mL | Freq: Once | OROMUCOSAL | Status: AC

## 2021-08-09 MED ORDER — PHENYLEPHRINE 40 MCG/ML (10ML) SYRINGE FOR IV PUSH (FOR BLOOD PRESSURE SUPPORT)
PREFILLED_SYRINGE | INTRAVENOUS | Status: AC
  Filled 2021-08-09: qty 10

## 2021-08-09 MED ORDER — CLONIDINE HCL (ANALGESIA) 100 MCG/ML EP SOLN
EPIDURAL | Status: DC | PRN
  Administered 2021-08-09: 100 ug

## 2021-08-09 MED ORDER — ROPIVACAINE HCL 5 MG/ML IJ SOLN
INTRAMUSCULAR | Status: DC | PRN
  Administered 2021-08-09: 40 mL via PERINEURAL

## 2021-08-09 MED ORDER — EPHEDRINE 5 MG/ML INJ
INTRAVENOUS | Status: AC
  Filled 2021-08-09: qty 5

## 2021-08-09 SURGICAL SUPPLY — 89 items
ANCHOR SUT KEITH ABD SZ2 STR (SUTURE) ×3 IMPLANT
APL PRP STRL LF DISP 70% ISPRP (MISCELLANEOUS) ×2
BAG COUNTER SPONGE SURGICOUNT (BAG) ×1 IMPLANT
BAG SPNG CNTER NS LX DISP (BAG) ×1
BANDAGE ESMARK 6X9 LF (GAUZE/BANDAGES/DRESSINGS) ×2 IMPLANT
BIT DRILL 2.5X2.75 QC CALB (BIT) ×1 IMPLANT
BIT DRILL 3.5X5.5 QC CALB (BIT) ×1 IMPLANT
BIT DRILL CALIBRATED 2.7 (BIT) ×1 IMPLANT
BLADE 15 SAFETY STRL DISP (BLADE) ×5 IMPLANT
BLADE LONG MED 31X9 (MISCELLANEOUS) ×2 IMPLANT
BNDG CMPR 9X6 STRL LF SNTH (GAUZE/BANDAGES/DRESSINGS) ×1
BNDG COHESIVE 4X5 TAN ST LF (GAUZE/BANDAGES/DRESSINGS) ×3 IMPLANT
BNDG COHESIVE 6X5 TAN NS LF (GAUZE/BANDAGES/DRESSINGS) ×3 IMPLANT
BNDG COHESIVE 6X5 TAN ST LF (GAUZE/BANDAGES/DRESSINGS) ×3 IMPLANT
BNDG ELASTIC 4X5.8 VLCR STR LF (GAUZE/BANDAGES/DRESSINGS) ×3 IMPLANT
BNDG ELASTIC 6X5.8 VLCR STR LF (GAUZE/BANDAGES/DRESSINGS) ×3 IMPLANT
BNDG ESMARK 6X9 LF (GAUZE/BANDAGES/DRESSINGS) ×2
BNDG GAUZE ELAST 4 BULKY (GAUZE/BANDAGES/DRESSINGS) ×3 IMPLANT
CHLORAPREP W/TINT 26 (MISCELLANEOUS) ×6 IMPLANT
COVER SURGICAL LIGHT HANDLE (MISCELLANEOUS) ×3 IMPLANT
CUFF TOURN SGL QUICK 34 (TOURNIQUET CUFF) ×2
CUFF TRNQT CYL 34X4.125X (TOURNIQUET CUFF) ×2 IMPLANT
DECANTER SPIKE VIAL GLASS SM (MISCELLANEOUS) IMPLANT
DRAPE C-ARM 42X120 X-RAY (DRAPES) ×1 IMPLANT
DRAPE C-ARM MINI 42X72 WSTRAPS (DRAPES) ×2 IMPLANT
DRAPE C-ARMOR (DRAPES) ×3 IMPLANT
DRAPE EXTREMITY T 121X128X90 (DISPOSABLE) ×3 IMPLANT
DRAPE OEC MINIVIEW 54X84 (DRAPES) ×2 IMPLANT
DRAPE ORTHO SPLIT 77X108 STRL (DRAPES) ×2
DRAPE SURG ORHT 6 SPLT 77X108 (DRAPES) ×4 IMPLANT
DRAPE U-SHAPE 47X51 STRL (DRAPES) ×3 IMPLANT
DRSG ADAPTIC 3X8 NADH LF (GAUZE/BANDAGES/DRESSINGS) ×3 IMPLANT
DRSG PAD ABDOMINAL 8X10 ST (GAUZE/BANDAGES/DRESSINGS) ×11 IMPLANT
ELECT REM PT RETURN 15FT ADLT (MISCELLANEOUS) ×3 IMPLANT
FACESHIELD WRAPAROUND (MASK) ×2 IMPLANT
FACESHIELD WRAPAROUND OR TEAM (MASK) ×2 IMPLANT
FIXATION ZIPTIGHT ANKLE SNDSMS (Ankle) IMPLANT
GAUZE SPONGE 4X4 12PLY STRL (GAUZE/BANDAGES/DRESSINGS) ×1 IMPLANT
GAUZE XEROFORM 5X9 LF (GAUZE/BANDAGES/DRESSINGS) ×3 IMPLANT
GLOVE SRG 8 PF TXTR STRL LF DI (GLOVE) ×4 IMPLANT
GLOVE SURG ENC MOIS LTX SZ7.5 (GLOVE) ×6 IMPLANT
GLOVE SURG MICRO LTX SZ7.5 (GLOVE) ×6 IMPLANT
GLOVE SURG POLYISO LF SZ7.5 (GLOVE) ×3 IMPLANT
GLOVE SURG UNDER POLY LF SZ7.5 (GLOVE) ×6 IMPLANT
GLOVE SURG UNDER POLY LF SZ8 (GLOVE) ×4
K-WIRE ACE 1.6X6 (WIRE) ×4
KIT BASIN OR (CUSTOM PROCEDURE TRAY) ×3 IMPLANT
KIT TURNOVER KIT A (KITS) IMPLANT
KWIRE ACE 1.6X6 (WIRE) IMPLANT
NDL MAYO CATGUT SZ4 TPR NDL (NEEDLE) IMPLANT
NEEDLE HYPO 22GX1.5 SAFETY (NEEDLE) ×2 IMPLANT
NEEDLE MAYO CATGUT SZ4 (NEEDLE) IMPLANT
NS IRRIG 1000ML POUR BTL (IV SOLUTION) ×3 IMPLANT
PACK ORTHO EXTREMITY (CUSTOM PROCEDURE TRAY) ×3 IMPLANT
PAD CAST 4YDX4 CTTN HI CHSV (CAST SUPPLIES) ×12 IMPLANT
PADDING CAST COTTON 4X4 STRL (CAST SUPPLIES) ×2
PADDING CAST COTTON 6X4 STRL (CAST SUPPLIES) ×3 IMPLANT
PLATE LOCK 8H 103 BILAT FIB (Plate) ×1 IMPLANT
PROTECTOR NERVE ULNAR (MISCELLANEOUS) ×3 IMPLANT
SCREW CORTICAL 3.5MM  20MM (Screw) ×2 IMPLANT
SCREW CORTICAL 3.5MM 20MM (Screw) IMPLANT
SCREW LOCK 3.5X12 DIST TIB (Screw) ×1 IMPLANT
SCREW LOCK CORT STAR 3.5X12 (Screw) ×1 IMPLANT
SCREW LOCK CORT STAR 3.5X14 (Screw) ×2 IMPLANT
SCREW LOW PROFILE 18MMX3.5MM (Screw) ×1 IMPLANT
SCREW NON LOCKING LP 3.5 16MM (Screw) ×1 IMPLANT
SPLINT PLASTER CAST XFAST 5X30 (CAST SUPPLIES) IMPLANT
SPLINT PLASTER XFAST SET 5X30 (CAST SUPPLIES) ×1
SPONGE T-LAP 18X18 ~~LOC~~+RFID (SPONGE) ×4 IMPLANT
STAPLER VISISTAT 35W (STAPLE) ×3 IMPLANT
STOCKINETTE TUBULAR 6 INCH (GAUZE/BANDAGES/DRESSINGS) ×3 IMPLANT
STOCKINETTE TUBULAR COTT 4X25 (GAUZE/BANDAGES/DRESSINGS) ×3 IMPLANT
STRIP CLOSURE SKIN 1/2X4 (GAUZE/BANDAGES/DRESSINGS) ×3 IMPLANT
SUCTION FRAZIER HANDLE 10FR (MISCELLANEOUS) ×2
SUCTION TUBE FRAZIER 10FR DISP (MISCELLANEOUS) ×2 IMPLANT
SUT ETHILON 3 0 PS 1 (SUTURE) ×3 IMPLANT
SUT MON AB 3-0 SH 27 (SUTURE) ×2
SUT MON AB 3-0 SH27 (SUTURE) ×2 IMPLANT
SUT VIC AB 2-0 CT1 27 (SUTURE) ×2
SUT VIC AB 2-0 CT1 TAPERPNT 27 (SUTURE) ×2 IMPLANT
SUT VIC AB 2-0 SH 27 (SUTURE) ×2
SUT VIC AB 2-0 SH 27XBRD (SUTURE) ×2 IMPLANT
SUT VIC AB 3-0 PS2 18 (SUTURE) ×2
SUT VIC AB 3-0 PS2 18XBRD (SUTURE) ×2 IMPLANT
SUT VIC AB 3-0 SH 27 (SUTURE) ×4
SUT VIC AB 3-0 SH 27X BRD (SUTURE) ×4 IMPLANT
SYR CONTROL 10ML LL (SYRINGE) ×2 IMPLANT
WATER STERILE IRR 1000ML POUR (IV SOLUTION) ×2 IMPLANT
ZIPTIGHT ANKLE SYNODESMOSS FIX (Ankle) ×2 IMPLANT

## 2021-08-09 NOTE — Transfer of Care (Signed)
Immediate Anesthesia Transfer of Care Note ? ?Patient: Michaela Carr ? ?Procedure(s) Performed: OPEN REDUCTION INTERNAL FIXATION (ORIF) ANKLE FRACTURE possible syndesmosis fixation (Right: Ankle) ? ?Patient Location: PACU ? ?Anesthesia Type:GA combined with regional for post-op pain ? ?Level of Consciousness: drowsy ? ?Airway & Oxygen Therapy: Patient Spontanous Breathing and Patient connected to nasal cannula oxygen ? ?Post-op Assessment: Report given to RN and Post -op Vital signs reviewed and stable ? ?Post vital signs: Reviewed and stable ? ?Last Vitals:  ?Vitals Value Taken Time  ?BP 151/91 08/09/21 1317  ?Temp    ?Pulse 83 08/09/21 1318  ?Resp 19 08/09/21 1318  ?SpO2 97 % 08/09/21 1318  ?Vitals shown include unvalidated device data. ? ?Last Pain:  ?Vitals:  ? 08/09/21 1040  ?TempSrc:   ?PainSc: 0-No pain  ?   ? ?  ? ?Complications: No notable events documented. ?

## 2021-08-09 NOTE — Op Note (Addendum)
08/09/2021 ? ?8:19 PM ? ? ?PATIENT: Michaela Carr  51 y.o. female ? ?MRN: [id number] ? ? ?PRE-OPERATIVE DIAGNOSIS:   ?S82.841A Displaced bimalleolar fracture of right lower leg ? ? ?POST-OPERATIVE DIAGNOSIS:   ?S82.841A Displaced bimalleolar fracture of right lower leg ? ? ?PROCEDURE: ?Open reduction internal fixation of right distal fibula ?Closed treatment of right posterior malleolus fracture ?Open reduction internal fixation of right ankle syndesmosis ? ? ?SURGEON:  Netta Cedars, MD ? ? ?ASSISTANT: None ? ? ?ANESTHESIA: General, regional ? ? ?EBL: Minimal ? ? ?TOURNIQUET:   ? ?Total Tourniquet Time Documented: ?Thigh (Right) - 76 minutes ?Total: Thigh (Right) - 76 minutes ? ? ? ?COMPLICATIONS: None apparent ? ? ?DISPOSITION: Extubated, awake and stable to recovery. ? ? ?INDICATION FOR PROCEDURE: ?The patient presented with right ankle fracture. There was an injury- DOI 08/01/2021 when patient injured ankle rollerblading. The patient has pain localized to the aforementioned area. Aggravating factors include prolonged weightbearing and activity. Relieving factors include rest and activity modification. The patient has thus far attempted activity modification. The patient does not have relevant surgical history. Of note, the patient's splint showed signs of weightbearing with dirt on the plantar aspect of the splint. We discussed the need for strict nonweightbearing postoperatively. ? ?We discussed the diagnosis, alternative treatment options, risks and benefits of the above surgical intervention, as well as alternative non-operative treatments. All questions/concerns were addressed and the patient/family demonstrated appropriate understanding of the diagnosis, the procedure, the postoperative course, and overall prognosis. The patient wished to proceed with surgical intervention and signed an informed surgical consent as such, in each others presence prior to surgery. ? ? ?PROCEDURE IN DETAIL: ?After preoperative  consent was obtained and the correct operative site was identified, the patient was brought to the operating room supine on stretcher and transferred onto operating table. General anesthesia was induced. Preoperative antibiotics were administered. Surgical timeout was taken. The patient was then positioned supine with an ipsilateral hip bump. The operative lower extremity was prepped and draped in standard sterile fashion with a tourniquet around the thigh. The extremity was exsanguinated and the tourniquet was inflated to 300 mmHg. ? ?A standard lateral incision was made over the distal fibula. Dissection was carried down to the level of the fibula and the fracture site identified. The superficial peroneal nerve was identified and protected throughout the procedure. The fibula was noted to be shortened with interposed periosteum. The fibula was brought out to length. Irrigation was used to flush out debris from the posterior malleolus to assist with reduction. The fibula fracture was debrided and the edges defined to achieve cortical read. Reduction maneuver was performed using pointed reduction forceps and lobster forceps. In this manner, the fibula length was restored and fracture reduced. A lag screw was placed according to the orientation of fracture lines and comminution. Due to poor bone quality and extensive comminution at the fracture site, it was decided to use an anatomic locking distal fibula plate. We then selected a Zimmer anatomic locking plate to match the anatomy of the distal fibula and placed it laterally. This was implanted under intraoperative fluoroscopy with a combination of distal locking screws and proximal cortical & locking screws. ? ?A manual external rotation stress radiograph was obtained and demonstrated widening of the ankle mortise. Given this intraoperative finding as well as preoperative fracture pattern, it was decided to reduce and fix the syndesmosis. Therefore a suture fixation  system (ZipTight device) was implanted through the fibula plate in cannulated fashion  to fix the syndesmosis. Anchor/button position was verified along anteromedial tibial cortex by fluoroscopy. A repeat stress radiograph showed complete stability of the ankle mortise to testing. ? ?The surgical sites were thoroughly irrigated. The tourniquet was deflated and hemostasis achieved. The deep layers were closed using 2-0 vicryl and the subcutaneous tissues closed using 3-0 vicryl. The skin was closed without tension using 3-0 nylon suture.  ?  ?The leg was cleaned with saline and sterile xeroform dressings with gauze were applied. A well padded bulky short leg splint was applied. The patient was awakened from anesthesia and transported to the recovery room in stable condition.  ? ? ?FOLLOW UP PLAN: ?-transfer to PACU, then home ?-strict NWB operative extremity, maximum elevation ?-maintain short leg splint until follow up ?-DVT Ppx: Xarelto 10 mg once daily x 3 wk and then Aspirin 325 mg twice daily x another 3 wk while NWB ?-follow up as outpatient in 7-10 days for wound check ?-sutures out in 2-3 weeks with exchange of short leg splint to short leg cast in outpatient office ? ? ?RADIOGRAPHS: ?AP, lateral, oblique and stress radiographs of the right ankle were obtained intraoperatively. These showed interval reduction and fixation of the fractures. Manual stress radiographs were taken and the ankle mortise and tibiofibular relationship were noted to be stable following fixation. All hardware is appropriately positioned and of the appropriate lengths. No other acute injuries are noted. ? ? ?Netta Cedars ?Orthopaedic Surgery ?[facility] ? ? ?

## 2021-08-09 NOTE — Anesthesia Procedure Notes (Addendum)
Anesthesia Regional Block: Adductor canal block  ? ?Pre-Anesthetic Checklist: , timeout performed,  Correct Patient, Correct Site, Correct Laterality,  Correct Procedure, Correct Position, site marked,  Risks and benefits discussed,  Surgical consent,  Pre-op evaluation,  At surgeon's request and post-op pain management ? ?Laterality: Right ? ?Prep: Dura Prep     ?  ?Needles:  ?Injection technique: Single-shot ? ?Needle Type: Echogenic Stimulator Needle   ? ? ?Needle Length: 10cm  ?Needle Gauge: 20  ? ? ? ?Additional Needles: ? ? ?Procedures:,,,, ultrasound used (permanent image in chart),,    ?Narrative:  ?Start time: 08/09/2021 10:29 AM ?End time: 08/09/2021 10:32 AM ?Injection made incrementally with aspirations every 5 mL. ? ?Performed by: Personally  ?Anesthesiologist: Atilano Median, DO ? ?Additional Notes: ?Patient identified. Risks/Benefits/Options discussed with patient including but not limited to bleeding, infection, nerve damage, failed block, incomplete pain control. Patient expressed understanding and wished to proceed. All questions were answered. Sterile technique was used throughout the entire procedure. Please see nursing notes for vital signs. Aspirated in 5cc intervals with injection for negative confirmation. Patient was given instructions on fall risk and not to get out of bed. All questions and concerns addressed with instructions to call with any issues or inadequate analgesia.   ?  ? ? ? ? ?

## 2021-08-09 NOTE — Discharge Instructions (Addendum)
[name], MD ?[facility] ? ?Please read the following information regarding your care after surgery. ? ?Medications  ?You only need a prescription for the narcotic pain medicine (ex. oxycodone, Percocet, Norco).  All of the other medicines listed below are available over the counter. ?? Aleve 2 pills twice a day for the first 3 days after surgery. ?? acetominophen (Tylenol) 650 mg every 4-6 hours as you need for minor to moderate pain ?? oxycodone as prescribed for severe pain ? ?? To help prevent blood clots, take xarelto 10 mg once daily for 3 weeks after surgery followed by aspirin (325 mg) twice daily for another 3 weeks after surgery.  You should also get up every hour while you are awake to move around. ? ?Weight Bearing ?? Do NOT bear any weight on the operated leg or foot. ? ?Cast / Splint / Dressing ?? If you have a splint, do NOT remove this. Keep your splint, cast or dressing clean and dry.  Don?t put anything (coat hanger, pencil, etc) down inside of it.  If it gets wet, call the office immediately to schedule an appointment for a cast change. ? ?Swelling ?IMPORTANT: It is normal for you to have swelling where you had surgery. To reduce swelling and pain, keep at least 3 pillows under your leg so that your toes are above your nose and your heel is above the level of your hip.  It may be necessary to keep your foot or leg elevated for several weeks.  This is critical to helping your incisions heal and your pain to feel better. ? ?Follow Up ?Call my office at [phone] when you are discharged from the hospital or surgery center to schedule an appointment to be seen 7-10 days after surgery. ? ?Call my office at [phone] if you develop a fever >101.5? F, nausea, vomiting, bleeding from the surgical site or severe pain.   ? ? ?

## 2021-08-09 NOTE — Anesthesia Procedure Notes (Addendum)
Anesthesia Regional Block: Popliteal block  ? ?Pre-Anesthetic Checklist: , timeout performed,  Correct Patient, Correct Site, Correct Laterality,  Correct Procedure, Correct Position, site marked,  Risks and benefits discussed,  Surgical consent,  Pre-op evaluation,  At surgeon's request and post-op pain management ? ?Laterality: Right ? ?Prep: Dura Prep     ?  ?Needles:  ?Injection technique: Single-shot ? ?Needle Type: Echogenic Stimulator Needle   ? ? ?Needle Length: 10cm  ?Needle Gauge: 20  ? ? ? ?Additional Needles: ? ? ?Procedures:,,,, ultrasound used (permanent image in chart),,    ?Narrative:  ?Start time: 08/09/2021 10:35 AM ?End time: 08/09/2021 10:40 AM ?Injection made incrementally with aspirations every 5 mL. ? ?Performed by: Personally  ?Anesthesiologist: Atilano Median, DO ? ?Additional Notes: ?Patient identified. Risks/Benefits/Options discussed with patient including but not limited to bleeding, infection, nerve damage, failed block, incomplete pain control. Patient expressed understanding and wished to proceed. All questions were answered. Sterile technique was used throughout the entire procedure. Please see nursing notes for vital signs. Aspirated in 5cc intervals with injection for negative confirmation. Patient was given instructions on fall risk and not to get out of bed. All questions and concerns addressed with instructions to call with any issues or inadequate analgesia.   ?  ? ? ? ? ?

## 2021-08-09 NOTE — Brief Op Note (Signed)
08/09/2021 ? ?1:23 PM ? ?PATIENT:  Michaela Carr  51 y.o. female ? ?PRE-OPERATIVE DIAGNOSIS:  S82.841A Displaced bimalleolar fracture of right lower leg, init ? ?POST-OPERATIVE DIAGNOSIS:  S82.841A Displaced bimalleolar fracture of right lower leg, init ? ?PROCEDURE:  Procedure(s): ?OPEN REDUCTION INTERNAL FIXATION (ORIF) ANKLE FRACTURE possible syndesmosis fixation (Right) ? ?SURGEON:  Surgeon(s) and Role: ?   [name], MD - Primary ? ?PHYSICIAN ASSISTANT: None ? ?ASSISTANTS: none  ? ?ANESTHESIA:   regional and general ? ?EBL:  Minimal  ? ?BLOOD ADMINISTERED:none ? ?DRAINS: none  ? ?LOCAL MEDICATIONS USED:  OTHER Vanc powder ? ?SPECIMEN:  No Specimen ? ?DISPOSITION OF SPECIMEN:  N/A ? ?COUNTS:  YES ? ?TOURNIQUET:   ?Total Tourniquet Time Documented: ?Thigh (Right) - 76 minutes ?Total: Thigh (Right) - 76 minutes ? ? ?DICTATION: .Note written in EPIC ? ?PLAN OF CARE: Discharge to home after PACU ? ?PATIENT DISPOSITION:  PACU - hemodynamically stable. ?  ?Delay start of Pharmacological VTE agent (>24hrs) due to surgical blood loss or risk of bleeding: no ? ?

## 2021-08-09 NOTE — Anesthesia Procedure Notes (Signed)
Procedure Name: LMA Insertion ?Date/Time: 08/09/2021 11:08 AM ?Performed by: Imagene Riches, CRNA ?Pre-anesthesia Checklist: Patient identified, Emergency Drugs available, Suction available and Patient being monitored ?Patient Re-evaluated:Patient Re-evaluated prior to induction ?Oxygen Delivery Method: Circle System Utilized ?Preoxygenation: Pre-oxygenation with 100% oxygen ?Induction Type: IV induction ?Ventilation: Mask ventilation without difficulty ?LMA: LMA inserted ?LMA Size: 4.0 ?Number of attempts: 1 ?Airway Equipment and Method: Bite block ?Placement Confirmation: positive ETCO2 ?Tube secured with: Tape ?Dental Injury: Teeth and Oropharynx as per pre-operative assessment  ? ? ? ? ?

## 2021-08-09 NOTE — H&P (Signed)
H&P Update: ? ?-History and Physical Reviewed ? ?-Patient has been re-examined ? ?-No change in the plan of care ? ?-The risks and benefits were presented and reviewed. The risks due to hardware failure/irritation, new/persistent infection, stiffness, nerve/vessel/tendon injury, nonunion/malunion, wound healing issues, development of arthritis, failure of this surgery, possibility of external fixation with delayed definitive surgery, need for further surgery, thromboembolic events, anesthesia/medical complications, amputation, death among others were discussed. The patient acknowledged the explanation, agreed to proceed with the plan and a consent was signed. ? ?Linda Biehn ? ?

## 2021-08-09 NOTE — Anesthesia Preprocedure Evaluation (Addendum)
Anesthesia Evaluation  ?Patient identified by MRN, date of birth, ID band ?Patient awake ? ? ? ?Reviewed: ?Allergy & Precautions, NPO status , Patient's Chart, lab work & pertinent test results ? ?Airway ?Mallampati: II ? ?TM Distance: >3 FB ?Neck ROM: Full ? ? ? Dental ?no notable dental hx. ? ?  ?Pulmonary ?sleep apnea , Current Smoker and Patient abstained from smoking.,  ?  ?Pulmonary exam normal ? ? ? ? ? ? ? Cardiovascular ?negative cardio ROS ? ? ?Rhythm:Regular Rate:Normal ? ? ?  ?Neuro/Psych ?negative neurological ROS ? negative psych ROS  ? GI/Hepatic ?negative GI ROS, Neg liver ROS,   ?Endo/Other  ?negative endocrine ROS ? Renal/GU ?negative Renal ROS  ?negative genitourinary ?  ?Musculoskeletal ?Ankle fracture  ? Abdominal ?Normal abdominal exam  (+)   ?Peds ? Hematology ?negative hematology ROS ?(+)   ?Anesthesia Other Findings ? ? Reproductive/Obstetrics ? ?  ? ? ? ? ? ? ? ? ? ? ? ? ? ?  ?  ? ? ? ? ? ? ? ?Anesthesia Physical ?Anesthesia Plan ? ?ASA: 2 ? ?Anesthesia Plan: General and Regional  ? ?Post-op Pain Management: Regional block*, Celebrex PO (pre-op)* and Tylenol PO (pre-op)*  ? ?Induction: Intravenous ? ?PONV Risk Score and Plan: 2 and Ondansetron, Dexamethasone, Midazolam and Treatment may vary due to age or medical condition ? ?Airway Management Planned: Mask and LMA ? ?Additional Equipment: None ? ?Intra-op Plan:  ? ?Post-operative Plan: Extubation in OR ? ?Informed Consent: I have reviewed the patients History and Physical, chart, labs and discussed the procedure including the risks, benefits and alternatives for the proposed anesthesia with the patient or authorized representative who has indicated his/her understanding and acceptance.  ? ? ? ?Dental advisory given ? ?Plan Discussed with: CRNA ? ?Anesthesia Plan Comments:   ? ? ? ? ? ? ?Anesthesia Quick Evaluation ? ?

## 2021-08-09 NOTE — Anesthesia Postprocedure Evaluation (Signed)
Anesthesia Post Note ? ?Patient: THEODOCIA DRINKARD ? ?Procedure(s) Performed: OPEN REDUCTION INTERNAL FIXATION (ORIF) ANKLE FRACTURE possible syndesmosis fixation (Right: Ankle) ? ?  ? ?Patient location during evaluation: PACU ?Anesthesia Type: Regional and General ?Level of consciousness: awake and alert ?Pain management: pain level controlled ?Vital Signs Assessment: post-procedure vital signs reviewed and stable ?Respiratory status: spontaneous breathing, nonlabored ventilation, respiratory function stable and patient connected to nasal cannula oxygen ?Cardiovascular status: blood pressure returned to baseline and stable ?Postop Assessment: no apparent nausea or vomiting ?Anesthetic complications: no ? ? ?No notable events documented. ? ?Last Vitals:  ?Vitals:  ? 08/09/21 1330 08/09/21 1342  ?BP: (!) 141/82 (!) 147/88  ?Pulse: 78 79  ?Resp: 14 17  ?Temp:  (!) 36.3 ?C  ?SpO2: 96% 99%  ?  ?Last Pain:  ?Vitals:  ? 08/09/21 1342  ?TempSrc:   ?PainSc: 0-No pain  ? ? ?  ?  ?  ?  ?  ?  ? ?March Rummage Vaunda Gutterman ? ? ? ? ?

## 2021-08-12 ENCOUNTER — Encounter (HOSPITAL_COMMUNITY): Payer: Self-pay | Admitting: Orthopaedic Surgery

## 2021-08-15 DIAGNOSIS — S82841D Displaced bimalleolar fracture of right lower leg, subsequent encounter for closed fracture with routine healing: Secondary | ICD-10-CM | POA: Diagnosis not present

## 2021-08-19 NOTE — H&P (Signed)
ORTHOPAEDIC SURGERY H&P ? ?Subjective: ? ?The patient presented with right ankle fracture. There was an injury- DOI 08/01/2021 when patient injured ankle rollerblading. The patient has pain localized to the aforementioned area. Aggravating factors include prolonged weightbearing and activity. Relieving factors include rest and activity modification. The patient has thus far attempted activity modification. The patient does not have relevant surgical history. ? ? ?Past Medical History:  ?Diagnosis Date  ? Back pain   ? no current problem  ? Joint pain   ? no current problem  ? Lower extremity edema   ? no current problem  ? Sciatica   ? pain  ? Sleep apnea   ? does not use cpap  ? STD (sexually transmitted disease)   ? trichomonas  ? ? ?Past Surgical History:  ?Procedure Laterality Date  ? ABLATION    ? gyn - fibroids  ? BREAST SURGERY  1997  ? breast reduction  ? LAPAROSCOPIC GASTRIC BANDING  2010  ? done by [facility]  ? ORIF ANKLE FRACTURE Right 08/09/2021  ? Procedure: OPEN REDUCTION INTERNAL FIXATION (ORIF) ANKLE FRACTURE possible syndesmosis fixation;  Surgeon: [name], MD;  Location: [facility];  Service: Orthopedics;  Laterality: Right;  ? WISDOM TOOTH EXTRACTION    ?  ? ?(Not in an outpatient encounter) ?  ? ?No Known Allergies ? ?Social History  ? ?Socioeconomic History  ? Marital status: Divorced  ?  Spouse name: Not on file  ? Number of children: Not on file  ? Years of education: Not on file  ? Highest education level: Not on file  ?Occupational History  ? Occupation: terster/inspector  ?Tobacco Use  ? Smoking status: Every Day  ?  Packs/day: 0.50  ?  Years: 30.00  ?  Pack years: 15.00  ?  Types: Cigarettes  ? Smokeless tobacco: Never  ? Tobacco comments:  ?  Pt. Already has information on quiting  ?Vaping Use  ? Vaping Use: Never used  ?Substance and Sexual Activity  ? Alcohol use: Yes  ?  Comment: social wine  ? Drug use: No  ? Sexual activity: Yes  ?  Birth control/protection: None  ?Other  Topics Concern  ? Not on file  ?Social History Narrative  ? Not on file  ? ?Social Determinants of Health  ? ?Financial Resource Strain: Not on file  ?Food Insecurity: Not on file  ?Transportation Needs: Not on file  ?Physical Activity: Not on file  ?Stress: Not on file  ?Social Connections: Not on file  ?Intimate Partner Violence: Not on file  ? ? ? ?History reviewed. No pertinent family history. ?  ?Review of Systems ?Pertinent items are noted in HPI. ? ?Objective: ?Vital signs in last 24 hours: ?Vitals with BMI 08/09/2021 08/09/2021 08/09/2021  ?Height - - -  ?Weight - - -  ?BMI - - -  ?Systolic Q000111Q [id number]  ?Diastolic 88 82 91  ?Pulse 79 78 86  ? ? ?  ?EXAM: ?General: ?Well nourished, well developed. Awake, alert and oriented to time, place, person. Normal mood and affect. No apparent distress. Breathing room air. ? ?Right Lower Extremity: ?Alignment - Neutral ?Deformity - None ?Skin intact ?Tenderness to palpation - right ankle ?5/5 TA, PT, GS, Per, EHL, FHL ?Sensation intact to light touch throughout ?Palpable DP and PT pulses ?Special testing: None ? ?The contralateral foot/ankle was examined for comparison and noted to be neurovascularly intact with no localized deformity, swelling, or tenderness. ? ?Imaging Review ?X-rays taken were  independently reviewed by me demonstrating right ankle fracture. ? ?Assessment/Plan: ?The clinical and radiographic findings were reviewed and discussed at length with the patient. ? ?The patient has right ankle fracture. ? ?We spoke at length about the natural course of these findings. We discussed nonoperative and operative treatment options in detail. ? ?I have recommended the following: ?Surgery in the form of right ankle fracture ORIF. The risks and benefits were presented and reviewed. The risks due to hardware failure/irritation, infection, stiffness, nerve/vessel/tendon injury, nonunion/malunion, wound healing issues, failure of this surgery, need for further surgery,  thromboembolic events, amputation, death among others were discussed. The patient acknowledged the explanation, agreed to proceed with the plan and a consent was signed. ? ?[name]  ?Orthopaedic Surgery ?[facility] ? ?

## 2021-08-29 DIAGNOSIS — S82841D Displaced bimalleolar fracture of right lower leg, subsequent encounter for closed fracture with routine healing: Secondary | ICD-10-CM | POA: Diagnosis not present

## 2021-09-12 DIAGNOSIS — S82841D Displaced bimalleolar fracture of right lower leg, subsequent encounter for closed fracture with routine healing: Secondary | ICD-10-CM | POA: Diagnosis not present

## 2021-09-26 DIAGNOSIS — S82841A Displaced bimalleolar fracture of right lower leg, initial encounter for closed fracture: Secondary | ICD-10-CM | POA: Diagnosis not present

## 2021-09-26 DIAGNOSIS — M25571 Pain in right ankle and joints of right foot: Secondary | ICD-10-CM | POA: Diagnosis not present

## 2021-10-24 DIAGNOSIS — S82841A Displaced bimalleolar fracture of right lower leg, initial encounter for closed fracture: Secondary | ICD-10-CM | POA: Diagnosis not present

## 2021-10-30 ENCOUNTER — Encounter: Payer: Self-pay | Admitting: Internal Medicine

## 2021-10-30 NOTE — Progress Notes (Signed)
    Subjective:    Patient ID: Michaela Carr, female    DOB: 1971/03/04, 51 y.o.   MRN: [id number]      HPI Michaela Carr is here for  Chief Complaint  Patient presents with   Follow-up    Patient would like to discuss mental status    2/25 - she fell rollerbladding - fractures of distal fibular and posterior tibia.  She had ORIF 08/09/21.  She is still in a boot and not able to walk much and can not exercise.     Her emotions are up and down - she feels depressed she can not move and she is gaining weight.  She is not sleeping as well.  She really needs help with her weight - it is causing more problems - especially depression.  She is trying to eat well.        Medications and allergies reviewed with patient and updated if appropriate.  Current Outpatient Medications on File Prior to Visit  Medication Sig Dispense Refill   Cholecalciferol (VITAMIN D) 50 MCG (2000 UT) CAPS Take 2,000 Units by mouth daily.     fluticasone (FLONASE) 50 MCG/ACT nasal spray Place 2 sprays into both nostrils daily as needed for allergies or rhinitis.     HYDROcodone-acetaminophen (NORCO/VICODIN) 5-325 MG tablet Take 2 tablets by mouth every 4 (four) hours as needed. (Patient taking differently: Take 2 tablets by mouth every 4 (four) hours as needed for moderate pain.) 10 tablet 0   ibuprofen (ADVIL) 800 MG tablet Take 800 mg by mouth every 8 (eight) hours as needed for mild pain.     naproxen (NAPROSYN) 500 MG tablet Take 500 mg by mouth 2 (two) times daily with a meal.     No current facility-administered medications on file prior to visit.    Review of Systems     Objective:   Vitals:   10/31/21 1417  BP: 134/78  Pulse: 71  Temp: 98.1 F (36.7 C)  SpO2: 96%   BP Readings from Last 3 Encounters:  10/31/21 134/78  08/09/21 (!) 147/88  08/02/21 (!) 142/75   Wt Readings from Last 3 Encounters:  08/09/21 240 lb (108.9 kg)  08/02/21 240 lb (108.9 kg)  04/11/21 245 lb (111.1 kg)   Body mass  index is 37.59 kg/m.    Physical Exam Constitutional:      General: She is not in acute distress.    Appearance: Normal appearance.  HENT:     Head: Normocephalic and atraumatic.  Skin:    General: Skin is warm and dry.  Neurological:     Mental Status: She is alert. Mental status is at baseline.  Psychiatric:        Mood and Affect: Mood normal.           Assessment & Plan:    See Problem List for Assessment and Plan of chronic medical problems.

## 2021-10-31 ENCOUNTER — Ambulatory Visit: Payer: BC Managed Care – PPO | Admitting: Internal Medicine

## 2021-10-31 DIAGNOSIS — F3289 Other specified depressive episodes: Secondary | ICD-10-CM

## 2021-10-31 DIAGNOSIS — R7303 Prediabetes: Secondary | ICD-10-CM

## 2021-10-31 DIAGNOSIS — F32A Depression, unspecified: Secondary | ICD-10-CM | POA: Insufficient documentation

## 2021-10-31 MED ORDER — SEMAGLUTIDE-WEIGHT MANAGEMENT 0.25 MG/0.5ML ~~LOC~~ SOAJ: 0.2500 mg | SUBCUTANEOUS | 0 refills | Status: DC

## 2021-10-31 NOTE — Patient Instructions (Addendum)
       Medications changes include :   wegovy injection weekly    Your prescription(s) have been sent to your pharmacy.     Return in about 3 months (around 01/31/2022) for follow up.

## 2021-10-31 NOTE — Assessment & Plan Note (Signed)
Chronic Would benefit from weight loss Not able to exercise at this time, but will restart regular exercise when she is able Low sugar/carbohydrate diet

## 2021-10-31 NOTE — Assessment & Plan Note (Addendum)
BMI 37.59 with prediabetes and depression She fractured her foot a few months ago and is still recovering-has not been able to exercise and therefore is gaining weight She is very upset about the weight gain and really feels like she needs help Hopefully will be able to start exercising again [place] healthy diet, decrease portions We will start Wegovy 0.25 mg weekly Discussed possible side effects.  Discussed how the medication works We will titrate monthly as tolerated Follow-up in 3 months

## 2021-10-31 NOTE — Assessment & Plan Note (Signed)
New She comes in today with new onset depression mostly related to her recent ankle fracture and not being able to do things she needs and wants to do She is also depressed because she is gaining weight which is making him more depressed At this point she really wants to focus on her weight and would like to avoid an antidepressant We will address the weight, but advised that if she needs something for her depression we can consider medication

## 2021-11-07 DIAGNOSIS — M25571 Pain in right ankle and joints of right foot: Secondary | ICD-10-CM | POA: Diagnosis not present

## 2021-11-07 DIAGNOSIS — S82841D Displaced bimalleolar fracture of right lower leg, subsequent encounter for closed fracture with routine healing: Secondary | ICD-10-CM | POA: Diagnosis not present

## 2021-11-10 ENCOUNTER — Encounter: Payer: Self-pay | Admitting: Internal Medicine

## 2021-11-14 DIAGNOSIS — S82841D Displaced bimalleolar fracture of right lower leg, subsequent encounter for closed fracture with routine healing: Secondary | ICD-10-CM | POA: Diagnosis not present

## 2021-11-14 DIAGNOSIS — M25571 Pain in right ankle and joints of right foot: Secondary | ICD-10-CM | POA: Diagnosis not present

## 2021-11-18 ENCOUNTER — Encounter: Payer: Self-pay | Admitting: Internal Medicine

## 2021-11-21 DIAGNOSIS — S82841D Displaced bimalleolar fracture of right lower leg, subsequent encounter for closed fracture with routine healing: Secondary | ICD-10-CM | POA: Diagnosis not present

## 2021-11-21 DIAGNOSIS — M25571 Pain in right ankle and joints of right foot: Secondary | ICD-10-CM | POA: Diagnosis not present

## 2021-11-28 DIAGNOSIS — M25571 Pain in right ankle and joints of right foot: Secondary | ICD-10-CM | POA: Diagnosis not present

## 2021-11-28 DIAGNOSIS — S82841D Displaced bimalleolar fracture of right lower leg, subsequent encounter for closed fracture with routine healing: Secondary | ICD-10-CM | POA: Diagnosis not present

## 2021-12-05 DIAGNOSIS — S82841D Displaced bimalleolar fracture of right lower leg, subsequent encounter for closed fracture with routine healing: Secondary | ICD-10-CM | POA: Diagnosis not present

## 2021-12-05 DIAGNOSIS — M25571 Pain in right ankle and joints of right foot: Secondary | ICD-10-CM | POA: Diagnosis not present

## 2021-12-10 ENCOUNTER — Other Ambulatory Visit: Payer: Self-pay

## 2021-12-10 MED ORDER — SEMAGLUTIDE-WEIGHT MANAGEMENT 0.25 MG/0.5ML ~~LOC~~ SOAJ: 0.2500 mg | SUBCUTANEOUS | 0 refills | Status: AC

## 2022-01-14 ENCOUNTER — Encounter (INDEPENDENT_AMBULATORY_CARE_PROVIDER_SITE_OTHER): Payer: Self-pay

## 2022-02-26 ENCOUNTER — Encounter: Payer: Self-pay | Admitting: Internal Medicine

## 2022-03-03 MED ORDER — WEGOVY 0.25 MG/0.5ML ~~LOC~~ SOAJ: 0.2500 mg | SUBCUTANEOUS | 0 refills | Status: DC

## 2022-03-03 NOTE — Addendum Note (Signed)
Addended by: Binnie Rail on: 03/03/2022 06:23 PM   Modules accepted: Orders

## 2022-03-13 ENCOUNTER — Telehealth: Payer: Self-pay | Admitting: Internal Medicine

## 2022-03-13 NOTE — Telephone Encounter (Signed)
Pt came in (03/13/2022) and picked up the samples of Wegovy that was left for her in the fridge.

## 2022-05-20 ENCOUNTER — Encounter: Payer: Self-pay | Admitting: Internal Medicine

## 2022-05-20 ENCOUNTER — Telehealth: Payer: Self-pay | Admitting: Internal Medicine

## 2022-05-20 NOTE — Telephone Encounter (Signed)
Patient needs to talk to [place] about the [facility].  [phone]

## 2022-05-21 ENCOUNTER — Other Ambulatory Visit: Payer: Self-pay

## 2022-05-21 ENCOUNTER — Telehealth: Payer: Self-pay

## 2022-05-21 MED ORDER — WEGOVY 0.5 MG/0.5ML ~~LOC~~ SOAJ: 0.5000 mg | SUBCUTANEOUS | 0 refills | Status: DC

## 2022-05-21 NOTE — Telephone Encounter (Signed)
My-chart message sent to patient today.  She is covered from 04/21/2022;Coverage End Date:12/17/2022.  New dose sent in for patient today.

## 2022-05-21 NOTE — Telephone Encounter (Signed)
(  Key: [id number])  Status:Approved;Review Type:Prior Auth;Coverage Start Date:04/21/2022;Coverage End Date:12/17/2022;

## 2022-06-03 ENCOUNTER — Encounter: Payer: Self-pay | Admitting: Internal Medicine

## 2022-06-06 MED ORDER — WEGOVY 0.25 MG/0.5ML ~~LOC~~ SOAJ: 0.2500 mg | SUBCUTANEOUS | 0 refills | Status: DC

## 2022-06-24 ENCOUNTER — Encounter: Payer: Self-pay | Admitting: Internal Medicine

## 2022-06-27 MED ORDER — ZEPBOUND 2.5 MG/0.5ML ~~LOC~~ SOAJ: 2.5000 mg | SUBCUTANEOUS | 0 refills | Status: DC

## 2022-06-28 MED ORDER — ZEPBOUND 2.5 MG/0.5ML ~~LOC~~ SOAJ: 2.5000 mg | SUBCUTANEOUS | 0 refills | Status: DC

## 2022-06-28 NOTE — Addendum Note (Signed)
Addended by: Binnie Rail on: 06/28/2022 03:09 PM   Modules accepted: Orders

## 2022-07-02 ENCOUNTER — Encounter: Payer: Self-pay | Admitting: Internal Medicine

## 2022-07-02 ENCOUNTER — Other Ambulatory Visit: Payer: Self-pay

## 2022-07-03 ENCOUNTER — Telehealth: Payer: Self-pay

## 2022-07-03 NOTE — Telephone Encounter (Signed)
Key: [id number]

## 2022-07-03 NOTE — Telephone Encounter (Signed)
Michaela Carr (Key: [id number]) PA Case ID #: [id number] R x #: [id number] Need Help? Call us at [phone]  Outcome  Approved today CaseId:[id number];Status:Approved;Review   Type:Prior Auth;Coverage Start Date:06/03/2022;Coverage End Date:02/28/2023;  Authorization Expiration Date: 02/27/2023  Drug Zepbound 2.5MG /0.5ML pen-injectors ePA cloud logo Form [employer] Electronic PA Form [phone] NCPDP) Original Claim Info 75

## 2022-07-17 ENCOUNTER — Encounter: Payer: Self-pay | Admitting: Internal Medicine

## 2022-07-17 ENCOUNTER — Other Ambulatory Visit: Payer: Self-pay | Admitting: Internal Medicine

## 2022-07-17 NOTE — Telephone Encounter (Signed)
Rec'd msg"  Needing next dose up please "./lmb

## 2022-07-18 MED ORDER — TIRZEPATIDE-WEIGHT MANAGEMENT 5 MG/0.5ML ~~LOC~~ SOAJ: 5.0000 mg | SUBCUTANEOUS | 0 refills | Status: DC

## 2022-08-16 ENCOUNTER — Other Ambulatory Visit: Payer: Self-pay | Admitting: Internal Medicine

## 2022-08-25 DIAGNOSIS — Z1231 Encounter for screening mammogram for malignant neoplasm of breast: Secondary | ICD-10-CM | POA: Diagnosis not present

## 2022-08-25 LAB — HM MAMMOGRAPHY

## 2022-08-27 ENCOUNTER — Encounter: Payer: Self-pay | Admitting: Internal Medicine

## 2022-08-27 NOTE — Progress Notes (Signed)
Outside notes received. Information abstracted. Notes sent to scan.  

## 2022-09-07 NOTE — Progress Notes (Unsigned)
Subjective:    Patient ID: Michaela Carr, female    DOB: May 25, 1971, 52 y.o.   MRN: [id number]      HPI [name] is here for a Physical exam and her chronic medical problems.   Taking zepbound and it is helping.  She felt like wegovy worked a little better.   Her energy level is just ok.     Wakes up and has neck, upper back tightness. She also has it during the day.  Has tried several different pillows, etc.  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  Medications and allergies reviewed with patient and updated if appropriate.  Current Outpatient Medications on File Prior to Visit  Medication Sig Dispense Refill   fluticasone (FLONASE) 50 MCG/ACT nasal spray Place 2 sprays into both nostrils daily as needed for allergies or rhinitis.     ibuprofen (ADVIL) 800 MG tablet Take 800 mg by mouth every 8 (eight) hours as needed for mild pain.     ZEPBOUND 5 MG/0.5ML Pen INJECT 5 MG INTO THE SKIN ONCE A WEEK. 4 mL 0   No current facility-administered medications on file prior to visit.    Review of Systems  Constitutional:  Negative for fever.  Eyes:  Negative for visual disturbance.  Respiratory:  Negative for cough, shortness of breath and wheezing.   Cardiovascular:  Negative for chest pain, palpitations and leg swelling.  Gastrointestinal:  Negative for abdominal pain, blood in stool, constipation and diarrhea.       Burping, No gerd  Genitourinary:  Negative for dysuria.  Musculoskeletal:  Positive for neck pain and neck stiffness. Negative for arthralgias and back pain.  Skin:  Negative for rash.  Neurological:  Positive for headaches (occ). Negative for light-headedness.  Psychiatric/Behavioral:  Negative for dysphoric mood. The patient is not nervous/anxious.        Objective:   Vitals:   09/08/22 1524  BP: 128/78  Pulse: 76  Temp: 98.4 F (36.9 C)  SpO2: 99%   Filed Weights   09/08/22 1524  Weight: 260 lb (117.9 kg)   Body mass index is 40.72  kg/m.  BP Readings from Last 3 Encounters:  09/08/22 128/78  10/31/21 134/78  08/09/21 (!) 147/88    Wt Readings from Last 3 Encounters:  09/08/22 260 lb (117.9 kg)  08/09/21 240 lb (108.9 kg)  08/02/21 240 lb (108.9 kg)       Physical Exam Constitutional: She appears well-developed and well-nourished. No distress.  HENT:  Head: Normocephalic and atraumatic.  Right Ear: External ear normal. Normal ear canal and TM Left Ear: External ear normal.  Normal ear canal and TM Mouth/Throat: Oropharynx is clear and moist.  Eyes: Conjunctivae normal.  Neck: Neck supple. No tracheal deviation present. No thyromegaly present.  No carotid bruit  Cardiovascular: Normal rate, regular rhythm and normal heart sounds.   No murmur heard.  No edema. Pulmonary/Chest: Effort normal and breath sounds normal. No respiratory distress. She has no wheezes. She has no rales.  Breast: deferred   Abdominal: Soft. She exhibits no distension. There is no tenderness.  Lymphadenopathy: She has no cervical adenopathy. MSK: Muscle tenderness posterior neck, bilateral trapezius Skin: Skin is warm and dry. She is not diaphoretic.  Psychiatric: She has a normal mood and affect. Her behavior is normal.     Lab Results  Component Value Date   WBC 6.7 08/09/2021   HGB 13.3 08/09/2021   HCT 42.9 08/09/2021  PLT 300 08/09/2021   GLUCOSE 89 09/03/2020   CHOL 189 09/03/2020   TRIG 56 09/03/2020   HDL 40 09/03/2020   LDLCALC 138 (H) 09/03/2020   ALT 15 09/03/2020   AST 19 09/03/2020   NA 143 09/03/2020   K 4.6 09/03/2020   CL 103 09/03/2020   CREATININE 0.76 09/03/2020   BUN 16 09/03/2020   CO2 23 09/03/2020   TSH 1.150 09/03/2020   HGBA1C 5.9 (H) 09/03/2020         Assessment & Plan:   Physical exam: Screening blood work  ordered Exercise  3/week gym, walking dog.  Weight  working on weight loss Substance abuse  none   Reviewed recommended immunizations.   Health Maintenance  Topic  Date Due   COLONOSCOPY (Pts 45-22yrs Insurance coverage will need to be confirmed)  Never done   Zoster Vaccines- Shingrix (1 of 2) Never done   PAP SMEAR-Modifier  03/03/2021   COVID-19 Vaccine (1) 09/24/2022 (Originally 02/06/1971)   INFLUENZA VACCINE  01/07/2023   MAMMOGRAM  08/24/2024   DTaP/Tdap/Td (3 - Td or Tdap) 03/09/2026   Hepatitis C Screening  Completed   HIV Screening  Completed   HPV VACCINES  Aged Out          See Problem List for Assessment and Plan of chronic medical problems.

## 2022-09-07 NOTE — Patient Instructions (Addendum)
Blood work was ordered.   The lab is on the first floor.    Medications changes include :   flexeril 10 mg at bedtime as needed.      Return in about 6 months (around 03/10/2023) for follow up.   Health Maintenance, Female Adopting a healthy lifestyle and getting preventive care are important in promoting health and wellness. Ask your health care provider about: The right schedule for you to have regular tests and exams. Things you can do on your own to prevent diseases and keep yourself healthy. What should I know about diet, weight, and exercise? Eat a healthy diet  Eat a diet that includes plenty of vegetables, fruits, low-fat dairy products, and lean protein. Do not eat a lot of foods that are high in solid fats, added sugars, or sodium. Maintain a healthy weight Body mass index (BMI) is used to identify weight problems. It estimates body fat based on height and weight. Your health care provider can help determine your BMI and help you achieve or maintain a healthy weight. Get regular exercise Get regular exercise. This is one of the most important things you can do for your health. Most adults should: Exercise for at least 150 minutes each week. The exercise should increase your heart rate and make you sweat (moderate-intensity exercise). Do strengthening exercises at least twice a week. This is in addition to the moderate-intensity exercise. Spend less time sitting. Even light physical activity can be beneficial. Watch cholesterol and blood lipids Have your blood tested for lipids and cholesterol at 53 years of age, then have this test every 5 years. Have your cholesterol levels checked more often if: Your lipid or cholesterol levels are high. You are older than 53 years of age. You are at high risk for heart disease. What should I know about cancer screening? Depending on your health history and family history, you may need to have cancer screening at various ages.  This may include screening for: Breast cancer. Cervical cancer. Colorectal cancer. Skin cancer. Lung cancer. What should I know about heart disease, diabetes, and high blood pressure? Blood pressure and heart disease High blood pressure causes heart disease and increases the risk of stroke. This is more likely to develop in people who have high blood pressure readings or are overweight. Have your blood pressure checked: Every 3-5 years if you are 63-58 years of age. Every year if you are 20 years old or older. Diabetes Have regular diabetes screenings. This checks your fasting blood sugar level. Have the screening done: Once every three years after age 53 if you are at a normal weight and have a low risk for diabetes. More often and at a younger age if you are overweight or have a high risk for diabetes. What should I know about preventing infection? Hepatitis B If you have a higher risk for hepatitis B, you should be screened for this virus. Talk with your health care provider to find out if you are at risk for hepatitis B infection. Hepatitis C Testing is recommended for: Everyone born from [birth year] through 1966. Anyone with known risk factors for hepatitis C. Sexually transmitted infections (STIs) Get screened for STIs, including gonorrhea and chlamydia, if: You are sexually active and are younger than 53 years of age. You are older than 53 years of age and your health care provider tells you that you are at risk for this type of infection. Your sexual activity has changed since you  were last screened, and you are at increased risk for chlamydia or gonorrhea. Ask your health care provider if you are at risk. Ask your health care provider about whether you are at high risk for HIV. Your health care provider may recommend a prescription medicine to help prevent HIV infection. If you choose to take medicine to prevent HIV, you should first get tested for HIV. You should then be tested every 3  months for as long as you are taking the medicine. Pregnancy If you are about to stop having your period (premenopausal) and you may become pregnant, seek counseling before you get pregnant. Take 400 to 800 micrograms (mcg) of folic acid every day if you become pregnant. Ask for birth control (contraception) if you want to prevent pregnancy. Osteoporosis and menopause Osteoporosis is a disease in which the bones lose minerals and strength with aging. This can result in bone fractures. If you are 53 years old or older, or if you are at risk for osteoporosis and fractures, ask your health care provider if you should: Be screened for bone loss. Take a calcium or vitamin D supplement to lower your risk of fractures. Be given hormone replacement therapy (HRT) to treat symptoms of menopause. Follow these instructions at home: Alcohol use Do not drink alcohol if: Your health care provider tells you not to drink. You are pregnant, may be pregnant, or are planning to become pregnant. If you drink alcohol: Limit how much you have to: 0-1 drink a day. Know how much alcohol is in your drink. In the [place] one drink equals one 12 oz bottle of beer (355 mL), one 5 oz glass of wine (148 mL), or one 1 oz glass of hard liquor (44 mL). Lifestyle Do not use any products that contain nicotine or tobacco. These products include cigarettes, chewing tobacco, and vaping devices, such as e-cigarettes. If you need help quitting, ask your health care provider. Do not use street drugs. Do not share needles. Ask your health care provider for help if you need support or information about quitting drugs. General instructions Schedule regular health, dental, and eye exams. Stay current with your vaccines. Tell your health care provider if: You often feel depressed. You have ever been abused or do not feel safe at home. Summary Adopting a healthy lifestyle and getting preventive care are important in promoting health  and wellness. Follow your health care provider's instructions about healthy diet, exercising, and getting tested or screened for diseases. Follow your health care provider's instructions on monitoring your cholesterol and blood pressure. This information is not intended to replace advice given to you by your health care provider. Make sure you discuss any questions you have with your health care provider. Document Revised: 10/14/2020 Document Reviewed: 10/14/2020 Elsevier Patient Education  [employer].

## 2022-09-08 ENCOUNTER — Ambulatory Visit (INDEPENDENT_AMBULATORY_CARE_PROVIDER_SITE_OTHER): Payer: BC Managed Care – PPO | Admitting: Internal Medicine

## 2022-09-08 ENCOUNTER — Encounter: Payer: Self-pay | Admitting: Internal Medicine

## 2022-09-08 VITALS — BP 128/78 | HR 76 | Temp 98.4°F | Ht 67.0 in | Wt 260.0 lb

## 2022-09-08 DIAGNOSIS — Z0000 Encounter for general adult medical examination without abnormal findings: Secondary | ICD-10-CM | POA: Diagnosis not present

## 2022-09-08 DIAGNOSIS — S29012A Strain of muscle and tendon of back wall of thorax, initial encounter: Secondary | ICD-10-CM | POA: Insufficient documentation

## 2022-09-08 DIAGNOSIS — Z1211 Encounter for screening for malignant neoplasm of colon: Secondary | ICD-10-CM

## 2022-09-08 DIAGNOSIS — E785 Hyperlipidemia, unspecified: Secondary | ICD-10-CM

## 2022-09-08 DIAGNOSIS — R7303 Prediabetes: Secondary | ICD-10-CM | POA: Diagnosis not present

## 2022-09-08 MED ORDER — CYCLOBENZAPRINE HCL 10 MG PO TABS: 10.0000 mg | ORAL_TABLET | Freq: Every day | ORAL | 1 refills | Status: DC

## 2022-09-08 NOTE — Assessment & Plan Note (Signed)
Subacute Experiencing bilateral trapezius muscle tightness and tightness and stiffness in the neck Possibly related to sitting at a computer all day-?  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

## 2022-09-08 NOTE — Assessment & Plan Note (Signed)
Chronic Check a1c Low sugar / carb diet Stressed regular exercise  

## 2022-09-08 NOTE — Assessment & Plan Note (Signed)
Chronic Working on weight loss She is exercising regularly and will try to increase that Continues to have found currently on 5 mg weekly-May increase in the near future-she knows this medication is helping but she needs to continue to work on weight loss on her own and this medication is temporary

## 2022-09-08 NOTE — Assessment & Plan Note (Signed)
Chronic Check lipid panel  Continue lifestyle control Regular exercise and healthy diet encouraged  

## 2022-10-19 ENCOUNTER — Encounter: Payer: Self-pay | Admitting: Internal Medicine

## 2022-10-19 ENCOUNTER — Other Ambulatory Visit: Payer: Self-pay

## 2022-10-19 MED ORDER — ZEPBOUND 7.5 MG/0.5ML ~~LOC~~ SOAJ: 7.5000 mg | SUBCUTANEOUS | 0 refills | Status: DC

## 2022-11-03 ENCOUNTER — Other Ambulatory Visit: Payer: Self-pay

## 2022-11-03 MED ORDER — ZEPBOUND 5 MG/0.5ML ~~LOC~~ SOAJ: 5.0000 mg | SUBCUTANEOUS | 1 refills | Status: DC

## 2022-12-21 ENCOUNTER — Other Ambulatory Visit: Payer: Self-pay | Admitting: Internal Medicine

## 2022-12-22 IMAGING — CT CT ANKLE*R* W/O CM
3 series · 14 of 33 positions shown, 17 images · non-contrast
Comparison: Plain films earlier today

CLINICAL DATA: Ankle fracture

EXAM:
CT OF THE RIGHT ANKLE WITHOUT CONTRAST
TECHNIQUE: Multidetector CT imaging of the right ankle was performed according
to the standard protocol. Multiplanar CT image reconstructions were
also generated.
RADIATION DOSE REDUCTION: This exam was performed according to the
departmental dose-optimization program which includes automated
exposure control, adjustment of the mA and/or kV according to
patient size and/or use of iterative reconstruction technique.

[Series 5: axial soft · axial · 0.40mm/px · z∈[-1108,-922]mm · 6 of 122 slices shown, 8 images]
[im 19/122  soft-tissue]
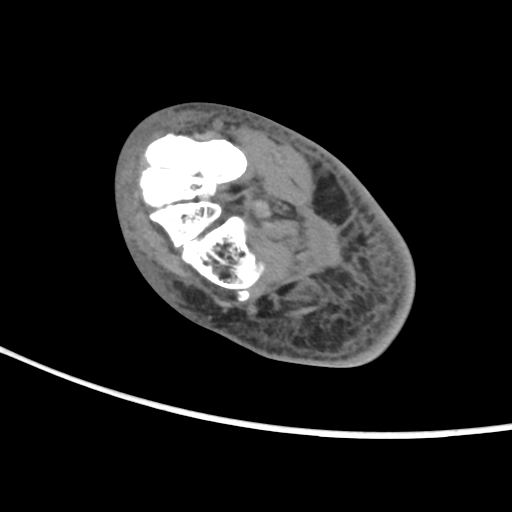
[im 19/122  bone]
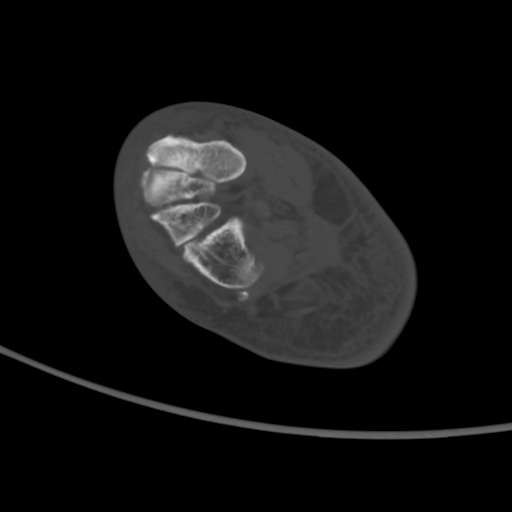
[im 38/122  bone]
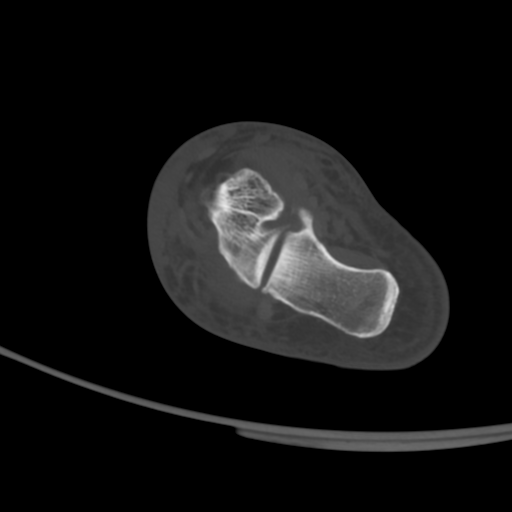
[im 56/122  bone]
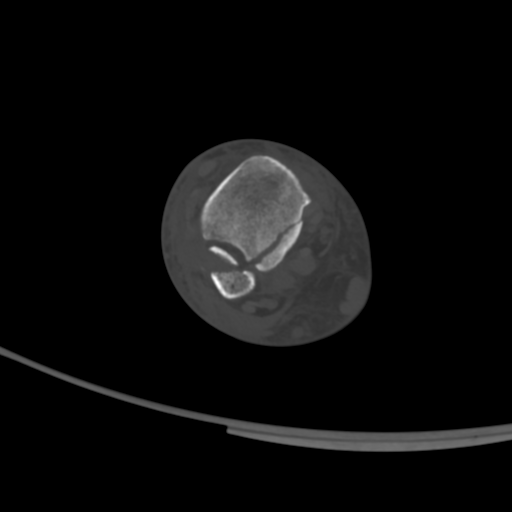
[im 75/122  bone]
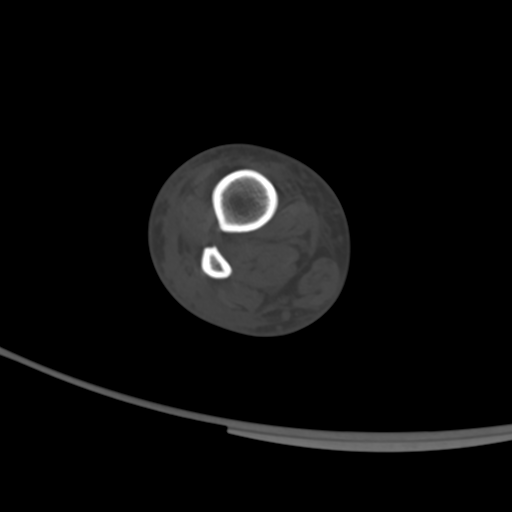
[im 94/122  soft-tissue]
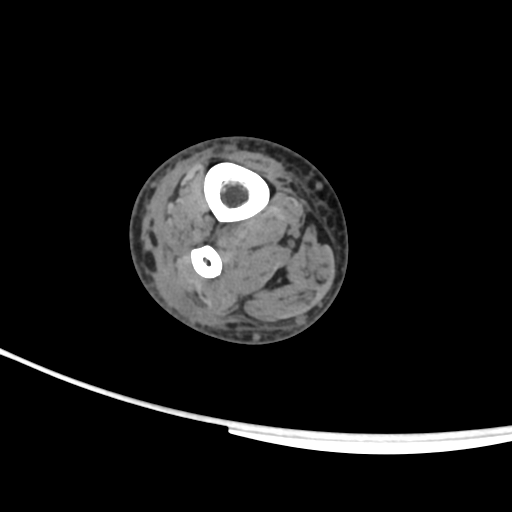
[im 94/122  bone]
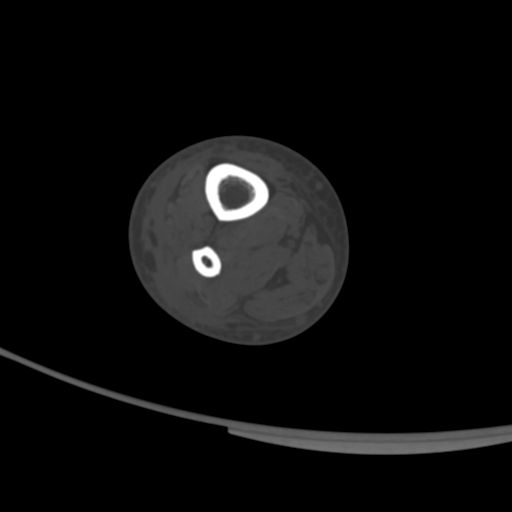
[im 112/122  bone]
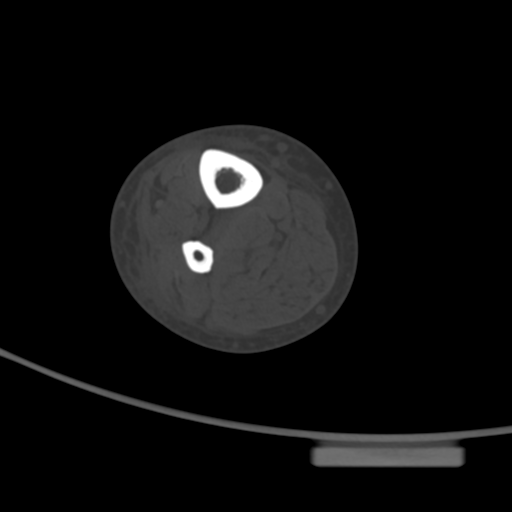

[Series 7: coronal soft · sagittal · 0.43mm/px · 5 of 87 slices shown, 6 images]
[im 29/87  bone]
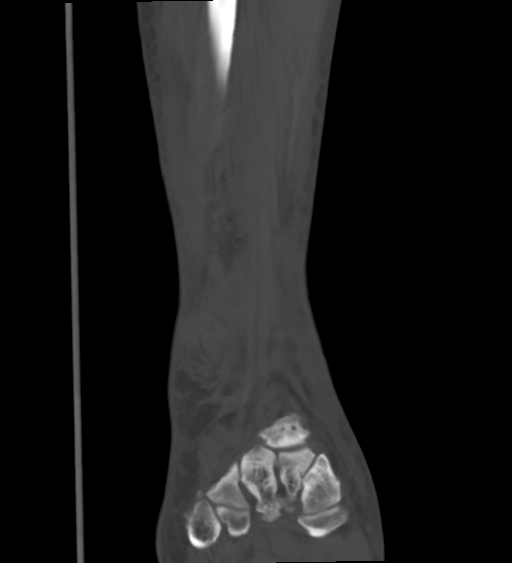
[im 36/87  bone]
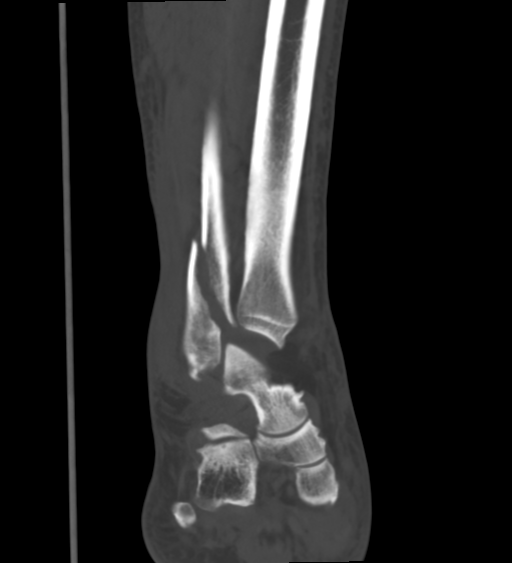
[im 44/87  soft-tissue]
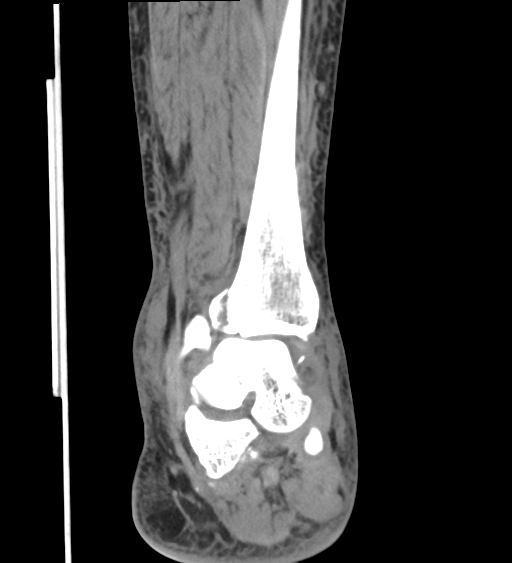
[im 44/87  bone]
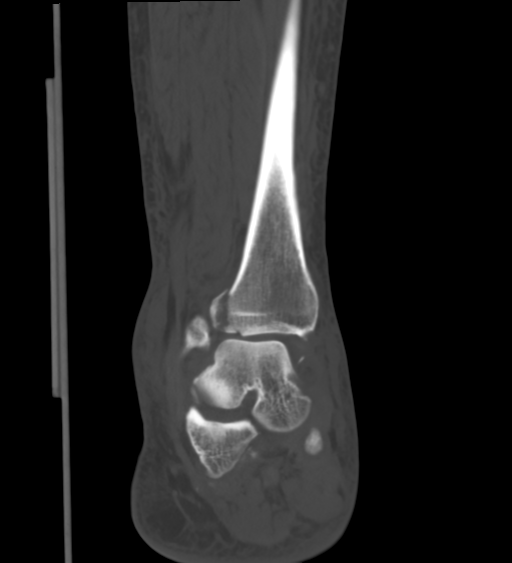
[im 51/87  bone]
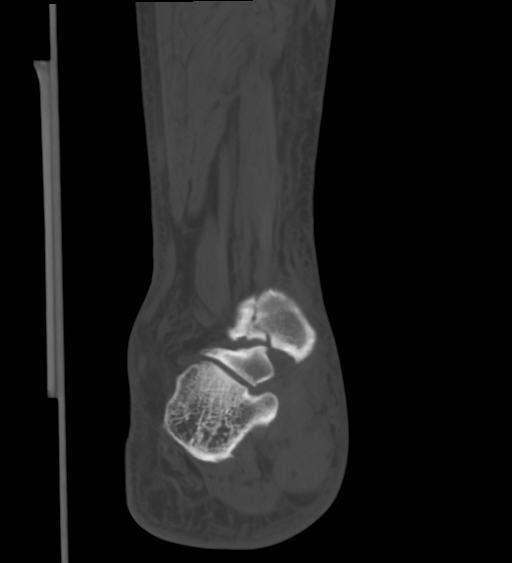
[im 58/87  bone]
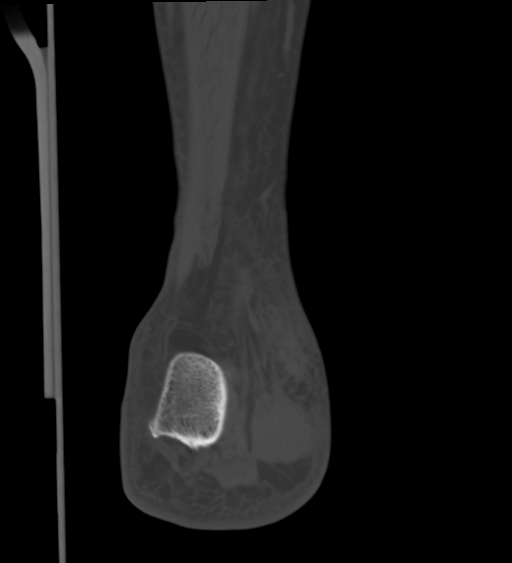

[Series 8: sagittal soft · coronal · 0.34mm/px · 3 of 80 slices shown]
[im 16/80  bone]
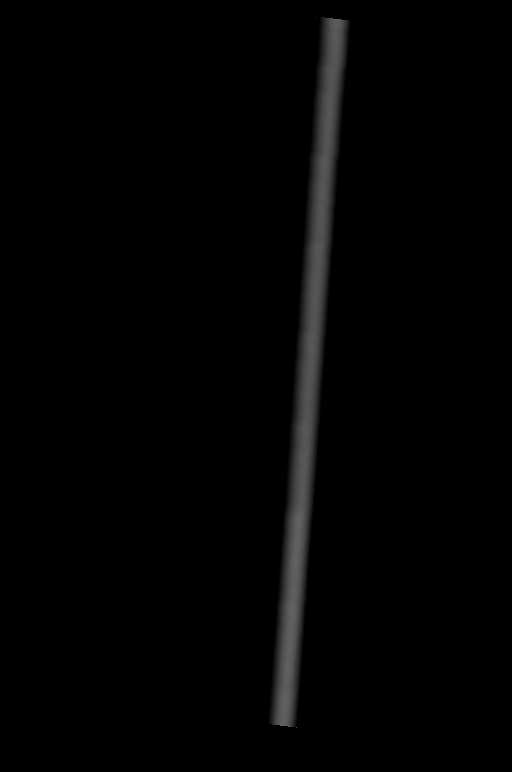
[im 32/80  bone]
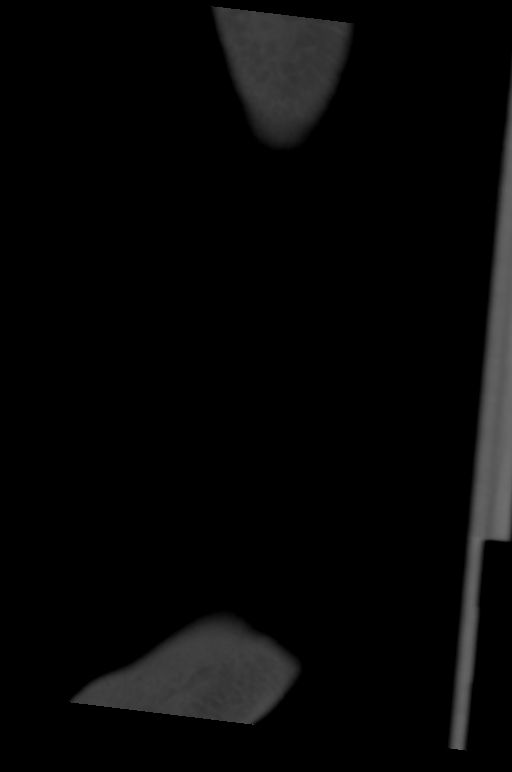
[im 48/80  bone]
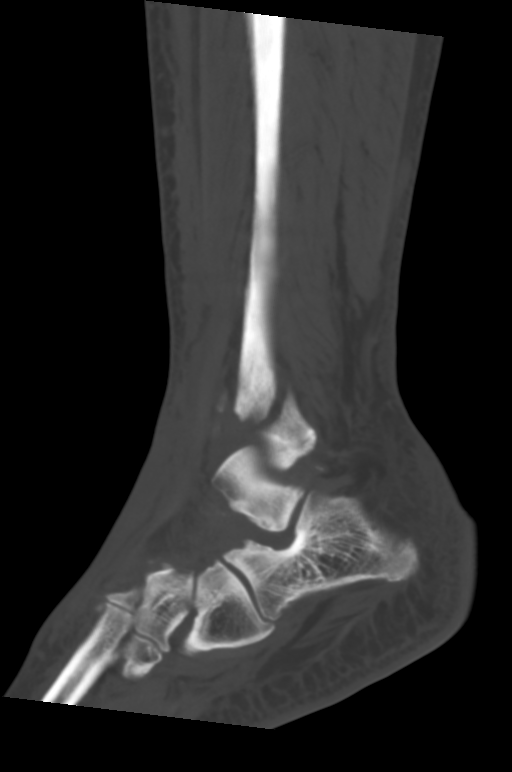

[14 of 33 positions shown; findings below may reference images not displayed]

FINDINGS: Bones/Joint/Cartilage

Oblique fracture noted through the distal fibula with mild
displacement of approximately 6 mm. This extends to the level of the
ankle mortise. Fracture noted through the posterior tibia/malleolus.
Minimal displacement of fracture fragments. There is widening of the
medial and anterior ankle mortise. No medial malleolar fracture.
Posterior and plantar calcaneal spurs.

Ligaments

Suboptimally assessed by CT.

Muscles and Tendons

Grossly unremarkable.

Soft tissues

Soft tissue swelling about the ankle.
IMPRESSION: Lateral and posterior malleolar fractures as described above.
Widening of the anterior and medial ankle mortise.

## 2022-12-28 ENCOUNTER — Encounter: Payer: Self-pay | Admitting: Internal Medicine

## 2022-12-29 IMAGING — RF DG ANKLE COMPLETE 3+V*R*
1 series · 5 of 5 positions shown · non-contrast
Comparison: July 28, 2021

CLINICAL DATA: RIGHT ankle ORIF

EXAM:
RIGHT ANKLE - COMPLETE 3+ VIEW

[Series 1: run · 5 of 5 slices shown]
[im 1/5]
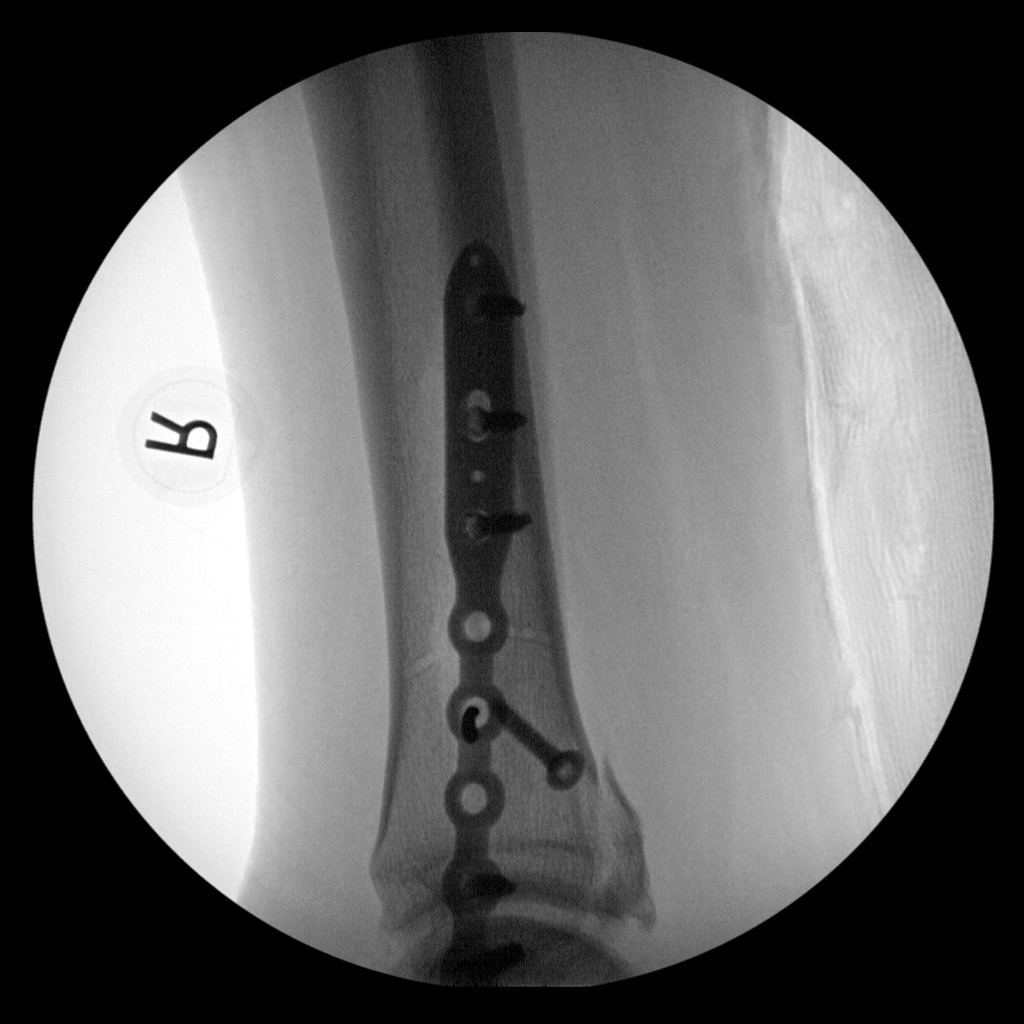
[im 2/5]
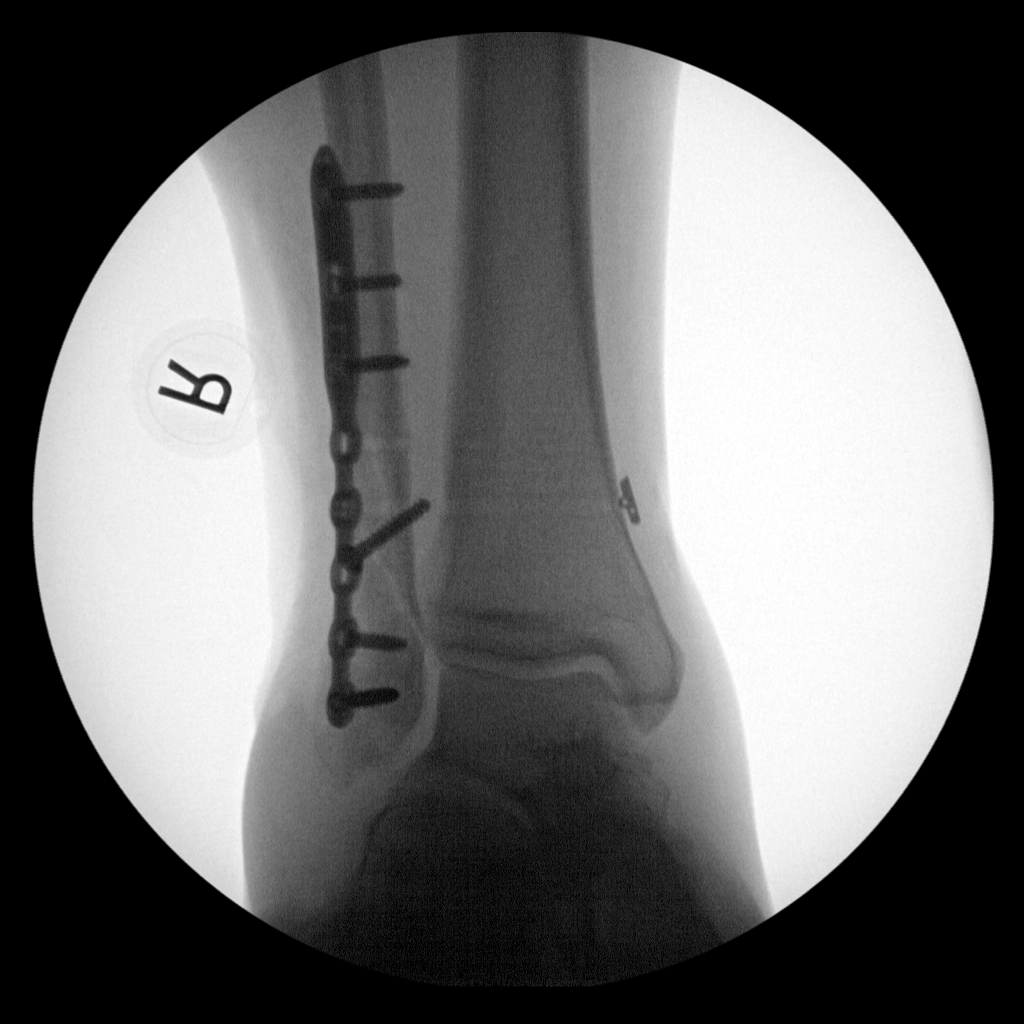
[im 3/5]
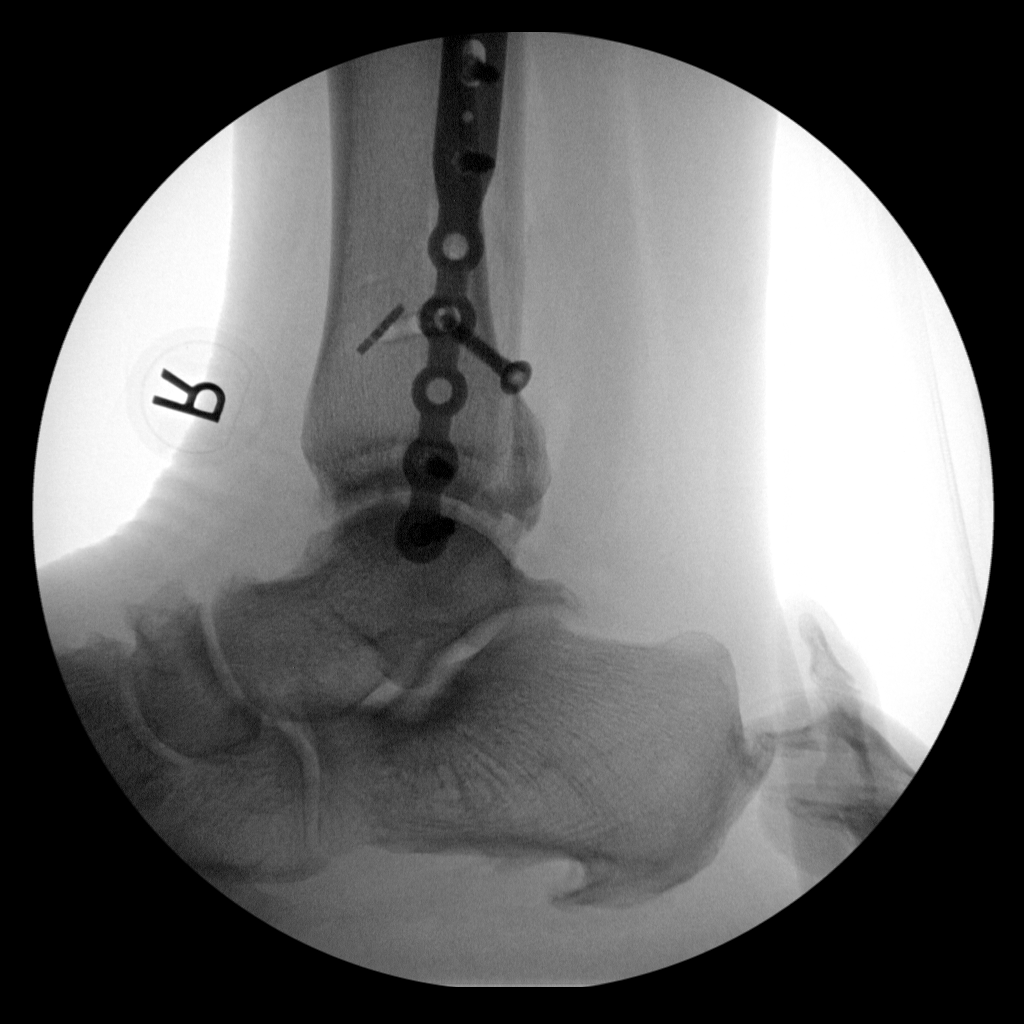
[im 4/5]
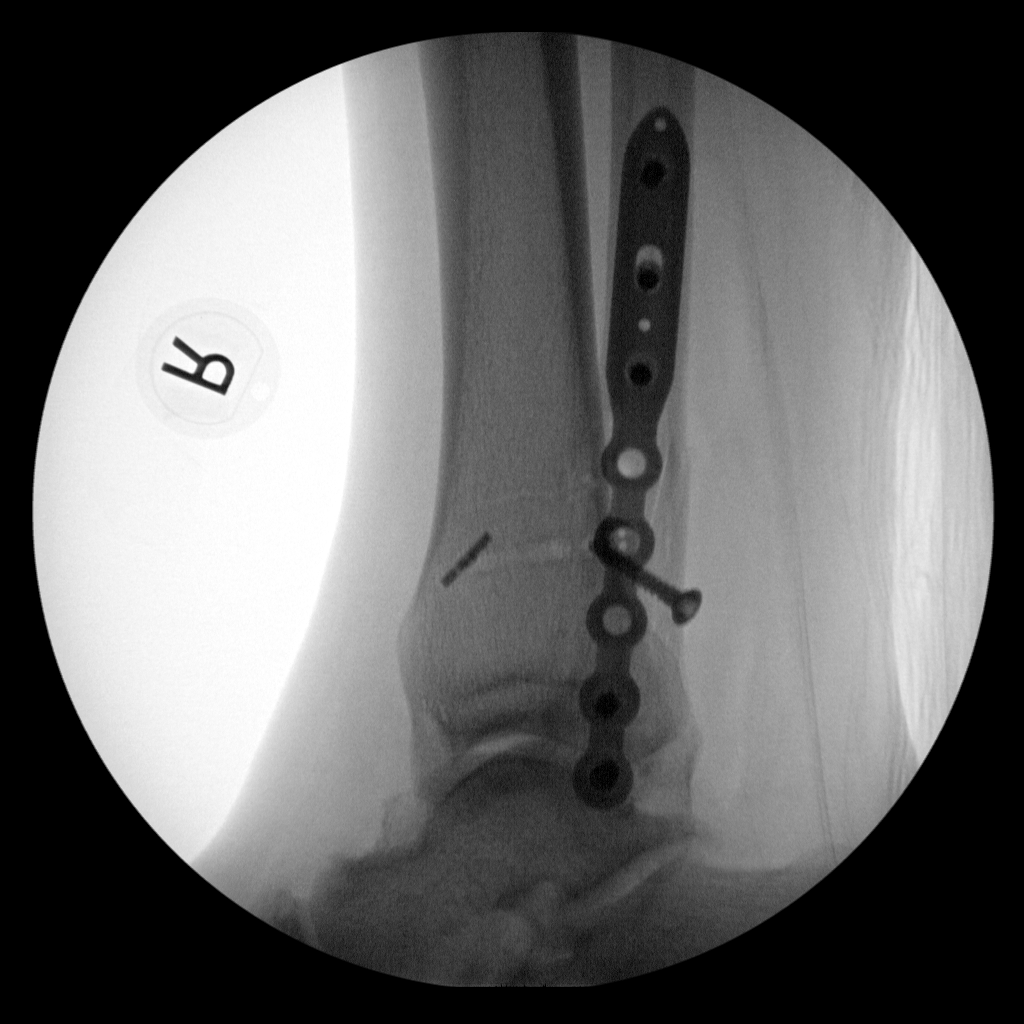
[im 5/5]
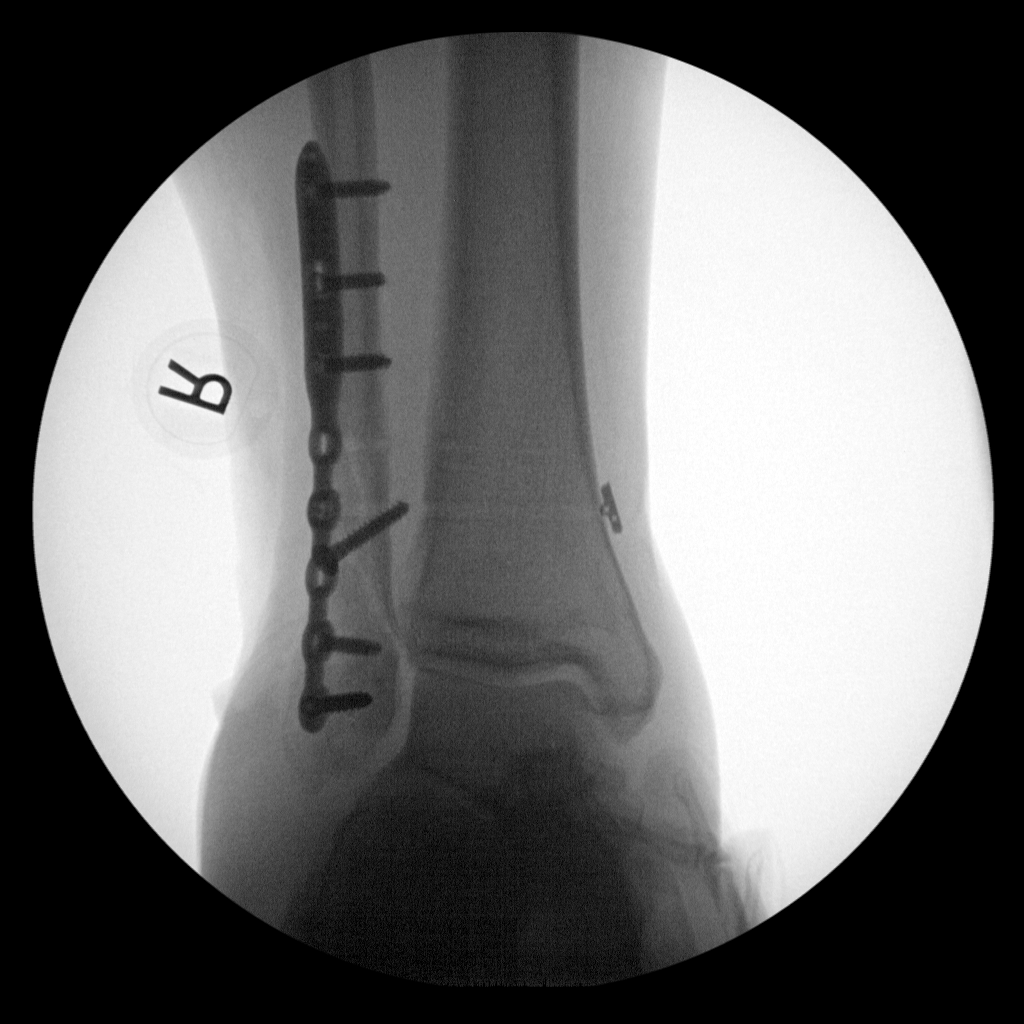

[5 of 5 positions shown; findings below may reference images not displayed]

FINDINGS: Spot fluoroscopy images were obtained for surgical planning
purposes. Status post or if of the RIGHT fibula with
transsyndesmotic fusion. Orthopedic hardware is intact and without
periprosthetic fracture or lucency. Improved alignment of RIGHT
fibular fracture. Soft tissue edema.
IMPRESSION: Status post ORIF of the RIGHT ankle.

## 2022-12-29 MED ORDER — WEGOVY 0.5 MG/0.5ML ~~LOC~~ SOAJ: 0.5000 mg | SUBCUTANEOUS | 2 refills | Status: DC

## 2023-01-13 ENCOUNTER — Encounter: Payer: Self-pay | Admitting: Internal Medicine

## 2023-01-13 ENCOUNTER — Other Ambulatory Visit: Payer: Self-pay

## 2023-01-14 MED ORDER — WEGOVY 1 MG/0.5ML ~~LOC~~ SOAJ: 1.0000 mg | SUBCUTANEOUS | 0 refills | Status: DC

## 2023-01-15 ENCOUNTER — Telehealth: Payer: Self-pay

## 2023-01-15 ENCOUNTER — Other Ambulatory Visit (HOSPITAL_COMMUNITY): Payer: Self-pay

## 2023-01-15 NOTE — Telephone Encounter (Signed)
My-chart sent to patient today that medication ready for pick up.

## 2023-01-15 NOTE — Telephone Encounter (Signed)
Pharmacy Patient Advocate Encounter   Received notification from Pt Calls Messages that prior authorization for Wegovy 1mg /0.38ml is required/requested.   Insurance verification completed.   The patient is insured through [employer] .   Per test claim: PA required; PA submitted to [employer] via CoverMyMeds Key/confirmation #/EOC [id number] Status is pending

## 2023-01-15 NOTE — Telephone Encounter (Signed)
Pharmacy Patient Advocate Encounter  Received notification from [employer] that Prior Authorization for Wegovy 1mg /0.33ml has been APPROVED from 12/16/22 to 08/13/23. Ran test claim, Copay is $24.99. This test claim was processed through [facility]- copay amounts may vary at other pharmacies due to pharmacy/plan contracts, or as the patient moves through the different stages of their insurance plan.   PA #/Case ID/Reference #: [id number]

## 2023-01-15 NOTE — Telephone Encounter (Signed)
Patient notified via mychart

## 2023-02-11 ENCOUNTER — Other Ambulatory Visit: Payer: Self-pay

## 2023-02-11 ENCOUNTER — Encounter: Payer: Self-pay | Admitting: Internal Medicine

## 2023-02-11 MED ORDER — SEMAGLUTIDE-WEIGHT MANAGEMENT 1.7 MG/0.75ML ~~LOC~~ SOAJ: 1.7000 mg | SUBCUTANEOUS | 0 refills | Status: DC

## 2023-02-18 ENCOUNTER — Encounter: Payer: Self-pay | Admitting: Internal Medicine

## 2023-02-18 NOTE — Patient Instructions (Signed)
      Blood work was ordered.   The lab is on the first floor.    Medications changes include :       A referral was ordered and someone will call you to schedule an appointment.     No follow-ups on file.  

## 2023-02-18 NOTE — Progress Notes (Signed)
      Subjective:    Patient ID: Michaela Carr, female    DOB: 05-May-1971, 52 y.o.   MRN: [id number]     HPI Latoya is here for follow up of her chronic medical problems.    Medications and allergies reviewed with patient and updated if appropriate.  Current Outpatient Medications on File Prior to Visit  Medication Sig Dispense Refill  . cyclobenzaprine (FLEXERIL) 10 MG tablet Take 1 tablet (10 mg total) by mouth at bedtime. 20 tablet 1  . fluticasone (FLONASE) 50 MCG/ACT nasal spray Place 2 sprays into both nostrils daily as needed for allergies or rhinitis.    Marland Kitchen ibuprofen (ADVIL) 800 MG tablet Take 800 mg by mouth every 8 (eight) hours as needed for mild pain.     No current facility-administered medications on file prior to visit.     Review of Systems     Objective:  There were no vitals filed for this visit. BP Readings from Last 3 Encounters:  03/10/23 128/72  09/08/22 128/78  10/31/21 134/78   Wt Readings from Last 3 Encounters:  03/10/23 242 lb (109.8 kg)  09/08/22 260 lb (117.9 kg)  08/09/21 240 lb (108.9 kg)   There is no height or weight on file to calculate BMI.    Physical Exam     Lab Results  Component Value Date   WBC 6.7 08/09/2021   HGB 13.3 08/09/2021   HCT 42.9 08/09/2021   PLT 300 08/09/2021   GLUCOSE 89 09/03/2020   CHOL 189 09/03/2020   TRIG 56 09/03/2020   HDL 40 09/03/2020   LDLCALC 138 (H) 09/03/2020   ALT 15 09/03/2020   AST 19 09/03/2020   NA 143 09/03/2020   K 4.6 09/03/2020   CL 103 09/03/2020   CREATININE 0.76 09/03/2020   BUN 16 09/03/2020   CO2 23 09/03/2020   TSH 1.150 09/03/2020   HGBA1C 5.9 (H) 09/03/2020     Assessment & Plan:    See Problem List for Assessment and Plan of chronic medical problems.    This encounter was created in error - please disregard.

## 2023-02-19 ENCOUNTER — Encounter: Payer: BC Managed Care – PPO | Admitting: Internal Medicine

## 2023-02-19 DIAGNOSIS — R7303 Prediabetes: Secondary | ICD-10-CM

## 2023-02-19 DIAGNOSIS — E785 Hyperlipidemia, unspecified: Secondary | ICD-10-CM

## 2023-02-19 DIAGNOSIS — G4733 Obstructive sleep apnea (adult) (pediatric): Secondary | ICD-10-CM

## 2023-03-09 NOTE — Patient Instructions (Addendum)
      Blood work was ordered.   The lab is on the first floor.    Medications changes include :  none       Return in 6 months (on 07/15/2023) for Physical Exam.

## 2023-03-09 NOTE — Progress Notes (Unsigned)
Subjective:    Patient ID: Michaela Carr, female    DOB: Apr 03, 1971, 52 y.o.   MRN: [id number]     HPI Michaela Carr is here for follow up of her chronic medical problems.  She is exercising.  She does cardio and weights.  She walks the dog daily.   Stress level is high - taking care of friend and helping mom.  Also work is busy and hours will be changing.   Medications and allergies reviewed with patient and updated if appropriate.  Current Outpatient Medications on File Prior to Visit  Medication Sig Dispense Refill   cyclobenzaprine (FLEXERIL) 10 MG tablet Take 1 tablet (10 mg total) by mouth at bedtime. 20 tablet 1   fluticasone (FLONASE) 50 MCG/ACT nasal spray Place 2 sprays into both nostrils daily as needed for allergies or rhinitis.     ibuprofen (ADVIL) 800 MG tablet Take 800 mg by mouth every 8 (eight) hours as needed for mild pain.     Semaglutide-Weight Management 1.7 MG/0.75ML SOAJ Inject 1.7 mg into the skin once a week. 3 mL 0   No current facility-administered medications on file prior to visit.     Review of Systems  Gastrointestinal:  Positive for constipation (mild at times with medicaiton).       Objective:   Vitals:   03/10/23 1556  BP: 128/72  Pulse: 70  Temp: 98.4 F (36.9 C)  SpO2: 94%   BP Readings from Last 3 Encounters:  03/10/23 128/72  09/08/22 128/78  10/31/21 134/78   Wt Readings from Last 3 Encounters:  03/10/23 242 lb (109.8 kg)  09/08/22 260 lb (117.9 kg)  08/09/21 240 lb (108.9 kg)   Body mass index is 37.9 kg/m.    Physical Exam Constitutional:      General: She is not in acute distress.    Appearance: Normal appearance.  HENT:     Head: Normocephalic and atraumatic.  Eyes:     Conjunctiva/sclera: Conjunctivae normal.  Cardiovascular:     Rate and Rhythm: Normal rate and regular rhythm.     Heart sounds: Normal heart sounds.  Pulmonary:     Effort: Pulmonary effort is normal. No respiratory distress.     Breath  sounds: Normal breath sounds. No wheezing.  Musculoskeletal:     Right lower leg: No edema.     Left lower leg: No edema.  Skin:    General: Skin is warm and dry.     Findings: No rash.  Neurological:     Mental Status: She is alert. Mental status is at baseline.  Psychiatric:        Mood and Affect: Mood normal.        Behavior: Behavior normal.        Lab Results  Component Value Date   WBC 6.7 08/09/2021   HGB 13.3 08/09/2021   HCT 42.9 08/09/2021   PLT 300 08/09/2021   GLUCOSE 89 09/03/2020   CHOL 189 09/03/2020   TRIG 56 09/03/2020   HDL 40 09/03/2020   LDLCALC 138 (H) 09/03/2020   ALT 15 09/03/2020   AST 19 09/03/2020   NA 143 09/03/2020   K 4.6 09/03/2020   CL 103 09/03/2020   CREATININE 0.76 09/03/2020   BUN 16 09/03/2020   CO2 23 09/03/2020   TSH 1.150 09/03/2020   HGBA1C 5.9 (H) 09/03/2020     Assessment & Plan:    See Problem List for Assessment and Plan of chronic  medical problems.

## 2023-03-10 ENCOUNTER — Ambulatory Visit (INDEPENDENT_AMBULATORY_CARE_PROVIDER_SITE_OTHER): Payer: BC Managed Care – PPO | Admitting: Internal Medicine

## 2023-03-10 ENCOUNTER — Encounter: Payer: Self-pay | Admitting: Internal Medicine

## 2023-03-10 VITALS — BP 128/72 | HR 70 | Temp 98.4°F | Ht 67.0 in | Wt 242.0 lb

## 2023-03-10 DIAGNOSIS — R7303 Prediabetes: Secondary | ICD-10-CM | POA: Diagnosis not present

## 2023-03-10 DIAGNOSIS — Z6837 Body mass index (BMI) 37.0-37.9, adult: Secondary | ICD-10-CM

## 2023-03-10 MED ORDER — SEMAGLUTIDE-WEIGHT MANAGEMENT 2.4 MG/0.75ML ~~LOC~~ SOAJ: 2.4000 mg | SUBCUTANEOUS | 2 refills | Status: DC

## 2023-03-10 NOTE — Assessment & Plan Note (Addendum)
Chronic Working on weight loss She is exercising regularly  On wegovy 1.7 mg weekly - > will increase to 2.4 mg weekly When she gets to her goal weight we may decrease dose of wegovy to a low dose for maintenance.

## 2023-03-10 NOTE — Assessment & Plan Note (Addendum)
Chronic Lab Results  Component Value Date   HGBA1C 5.9 (H) 09/03/2020   Low sugar / carb diet Stressed regular exercise Continue weight loss efforts

## 2023-06-07 ENCOUNTER — Other Ambulatory Visit: Payer: Self-pay | Admitting: Internal Medicine

## 2023-07-05 ENCOUNTER — Other Ambulatory Visit: Payer: Self-pay | Admitting: Internal Medicine

## 2023-07-31 ENCOUNTER — Other Ambulatory Visit: Payer: Self-pay | Admitting: Internal Medicine

## 2023-08-31 ENCOUNTER — Other Ambulatory Visit: Payer: Self-pay | Admitting: Internal Medicine

## 2023-09-03 ENCOUNTER — Telehealth: Payer: Self-pay | Admitting: Pharmacy Technician

## 2023-09-03 ENCOUNTER — Other Ambulatory Visit (HOSPITAL_COMMUNITY): Payer: Self-pay

## 2023-09-03 NOTE — Telephone Encounter (Signed)
 Pharmacy Patient Advocate Encounter  Received notification from [facility] that Prior Authorization for [facility] 2.4MG  has been APPROVED from 08/04/23 to 09/02/24. Ran test claim, Copay is $24.99. This test claim was processed through [facility]- copay amounts may vary at other pharmacies due to pharmacy/plan contracts, or as the patient moves through the different stages of their insurance plan.   PA #/Case ID/Reference #: [id number]

## 2023-09-27 ENCOUNTER — Other Ambulatory Visit: Payer: Self-pay | Admitting: Internal Medicine

## 2023-10-01 DIAGNOSIS — Z1231 Encounter for screening mammogram for malignant neoplasm of breast: Secondary | ICD-10-CM | POA: Diagnosis not present

## 2023-10-01 LAB — HM MAMMOGRAPHY

## 2023-10-04 ENCOUNTER — Encounter: Payer: Self-pay | Admitting: Internal Medicine

## 2023-10-24 ENCOUNTER — Other Ambulatory Visit: Payer: Self-pay | Admitting: Internal Medicine

## 2023-11-22 ENCOUNTER — Other Ambulatory Visit: Payer: Self-pay | Admitting: Internal Medicine

## 2023-11-25 ENCOUNTER — Encounter: Payer: Self-pay | Admitting: Internal Medicine

## 2023-11-25 NOTE — Patient Instructions (Addendum)
 Blood work was ordered.       Medications changes include :   None    A referral was ordered and someone will call you to schedule an appointment.     Return in about 3 months (around 02/26/2024) for follow up.   Health Maintenance, Female Adopting a healthy lifestyle and getting preventive care are important in promoting health and wellness. Ask your health care provider about: The right schedule for you to have regular tests and exams. Things you can do on your own to prevent diseases and keep yourself healthy. What should I know about diet, weight, and exercise? Eat a healthy diet  Eat a diet that includes plenty of vegetables, fruits, low-fat dairy products, and lean protein. Do not eat a lot of foods that are high in solid fats, added sugars, or sodium. Maintain a healthy weight Body mass index (BMI) is used to identify weight problems. It estimates body fat based on height and weight. Your health care provider can help determine your BMI and help you achieve or maintain a healthy weight. Get regular exercise Get regular exercise. This is one of the most important things you can do for your health. Most adults should: Exercise for at least 150 minutes each week. The exercise should increase your heart rate and make you sweat (moderate-intensity exercise). Do strengthening exercises at least twice a week. This is in addition to the moderate-intensity exercise. Spend less time sitting. Even light physical activity can be beneficial. Watch cholesterol and blood lipids Have your blood tested for lipids and cholesterol at 54 years of age, then have this test every 5 years. Have your cholesterol levels checked more often if: Your lipid or cholesterol levels are high. You are older than 54 years of age. You are at high risk for heart disease. What should I know about cancer screening? Depending on your health history and family history, you may need to have cancer  screening at various ages. This may include screening for: Breast cancer. Cervical cancer. Colorectal cancer. Skin cancer. Lung cancer. What should I know about heart disease, diabetes, and high blood pressure? Blood pressure and heart disease High blood pressure causes heart disease and increases the risk of stroke. This is more likely to develop in people who have high blood pressure readings or are overweight. Have your blood pressure checked: Every 3-5 years if you are 68-35 years of age. Every year if you are 66 years old or older. Diabetes Have regular diabetes screenings. This checks your fasting blood sugar level. Have the screening done: Once every three years after age 44 if you are at a normal weight and have a low risk for diabetes. More often and at a younger age if you are overweight or have a high risk for diabetes. What should I know about preventing infection? Hepatitis B If you have a higher risk for hepatitis B, you should be screened for this virus. Talk with your health care provider to find out if you are at risk for hepatitis B infection. Hepatitis C Testing is recommended for: Everyone born from [birth year] through 1966. Anyone with known risk factors for hepatitis C. Sexually transmitted infections (STIs) Get screened for STIs, including gonorrhea and chlamydia, if: You are sexually active and are younger than 54 years of age. You are older than 54 years of age and your health care provider tells you that you are at risk for this type of infection. Your sexual activity has  changed since you were last screened, and you are at increased risk for chlamydia or gonorrhea. Ask your health care provider if you are at risk. Ask your health care provider about whether you are at high risk for HIV. Your health care provider may recommend a prescription medicine to help prevent HIV infection. If you choose to take medicine to prevent HIV, you should first get tested for HIV. You  should then be tested every 3 months for as long as you are taking the medicine. Pregnancy If you are about to stop having your period (premenopausal) and you may become pregnant, seek counseling before you get pregnant. Take 400 to 800 micrograms (mcg) of folic acid  every day if you become pregnant. Ask for birth control (contraception) if you want to prevent pregnancy. Osteoporosis and menopause Osteoporosis is a disease in which the bones lose minerals and strength with aging. This can result in bone fractures. If you are 69 years old or older, or if you are at risk for osteoporosis and fractures, ask your health care provider if you should: Be screened for bone loss. Take a calcium or vitamin D supplement to lower your risk of fractures. Be given hormone replacement therapy (HRT) to treat symptoms of menopause. Follow these instructions at home: Alcohol use Do not drink alcohol if: Your health care provider tells you not to drink. You are pregnant, may be pregnant, or are planning to become pregnant. If you drink alcohol: Limit how much you have to: 0-1 drink a day. Know how much alcohol is in your drink. In the [place] one drink equals one 12 oz bottle of beer (355 mL), one 5 oz glass of wine (148 mL), or one 1 oz glass of hard liquor (44 mL). Lifestyle Do not use any products that contain nicotine or tobacco. These products include cigarettes, chewing tobacco, and vaping devices, such as e-cigarettes. If you need help quitting, ask your health care provider. Do not use street drugs. Do not share needles. Ask your health care provider for help if you need support or information about quitting drugs. General instructions Schedule regular health, dental, and eye exams. Stay current with your vaccines. Tell your health care provider if: You often feel depressed. You have ever been abused or do not feel safe at home. Summary Adopting a healthy lifestyle and getting preventive care are  important in promoting health and wellness. Follow your health care provider's instructions about healthy diet, exercising, and getting tested or screened for diseases. Follow your health care provider's instructions on monitoring your cholesterol and blood pressure. This information is not intended to replace advice given to you by your health care provider. Make sure you discuss any questions you have with your health care provider. Document Revised: 10/14/2020 Document Reviewed: 10/14/2020 Elsevier Patient Education  2024 [employer].

## 2023-11-25 NOTE — Progress Notes (Unsigned)
 Subjective:    Patient ID: Michaela Carr, female    DOB: 1970/11/30, 53 y.o.   MRN: [id number]      HPI Michaela Carr is here for a Physical exam and her chronic medical problems.   No concerns.     Medications and allergies reviewed with patient and updated if appropriate.  Current Outpatient Medications on File Prior to Visit  Medication Sig Dispense Refill   cyclobenzaprine  (FLEXERIL ) 10 MG tablet Take 1 tablet (10 mg total) by mouth at bedtime. 20 tablet 1   WEGOVY  2.4 MG/0.75ML SOAJ INJECT 2.4 MG SUBCUTANEOUSLY ONCE A WEEK. PATIENT PAST DUE FOR PHYSICAL. SHE NEEDS RETURN OFFICE VISIT 4 mL 0   fluticasone  (FLONASE ) 50 MCG/ACT nasal spray Place 2 sprays into both nostrils daily as needed for allergies or rhinitis.     ibuprofen  (ADVIL ) 800 MG tablet Take 800 mg by mouth every 8 (eight) hours as needed for mild pain.     No current facility-administered medications on file prior to visit.    Review of Systems  Constitutional:  Negative for fever.  Eyes:  Negative for visual disturbance.  Respiratory:  Negative for cough, shortness of breath and wheezing.   Cardiovascular:  Negative for chest pain, palpitations and leg swelling.  Gastrointestinal:  Negative for abdominal pain, blood in stool, constipation and diarrhea.       No gerd  Genitourinary:  Negative for dysuria.  Musculoskeletal:  Negative for arthralgias and back pain.  Skin:  Positive for rash (left elbow - itchy bumps).       Yeast infection skin - under panus  Neurological:  Positive for headaches. Negative for light-headedness.  Psychiatric/Behavioral:  Negative for dysphoric mood. The patient is not nervous/anxious.        Objective:   Vitals:   11/26/23 1257  BP: 136/78  Pulse: 76  Temp: 98.2 F (36.8 C)  SpO2: 98%   Filed Weights   11/26/23 1257  Weight: 230 lb 6.4 oz (104.5 kg)   Body mass index is 36.09 kg/m.  BP Readings from Last 3 Encounters:  11/26/23 136/78  03/10/23 128/72  09/08/22  128/78    Wt Readings from Last 3 Encounters:  11/26/23 230 lb 6.4 oz (104.5 kg)  03/10/23 242 lb (109.8 kg)  09/08/22 260 lb (117.9 kg)       Physical Exam Constitutional: She appears well-developed and well-nourished. No distress.  HENT:  Head: Normocephalic and atraumatic.  Right Ear: External ear normal. Normal ear canal and TM Left Ear: External ear normal.  Normal ear canal and TM Mouth/Throat: Oropharynx is clear and moist.  Eyes: Conjunctivae normal.  Neck: Neck supple. No tracheal deviation present. No thyromegaly present.  No carotid bruit  Cardiovascular: Normal rate, regular rhythm and normal heart sounds.   No murmur heard.  No edema. Pulmonary/Chest: Effort normal and breath sounds normal. No respiratory distress. She has no wheezes. She has no rales.  Breast: deferred   Abdominal: Soft. She exhibits no distension. There is no tenderness.  Lymphadenopathy: She has no cervical adenopathy.  Skin: Skin is warm and dry. She is not diaphoretic.  Psychiatric: She has a normal mood and affect. Her behavior is normal.     Lab Results  Component Value Date   WBC 6.7 08/09/2021   HGB 13.3 08/09/2021   HCT 42.9 08/09/2021   PLT 300 08/09/2021   GLUCOSE 89 09/03/2020   CHOL 189 09/03/2020   TRIG 56 09/03/2020   HDL 40 09/03/2020  LDLCALC 138 (H) 09/03/2020   ALT 15 09/03/2020   AST 19 09/03/2020   NA 143 09/03/2020   K 4.6 09/03/2020   CL 103 09/03/2020   CREATININE 0.76 09/03/2020   BUN 16 09/03/2020   CO2 23 09/03/2020   TSH 1.150 09/03/2020   HGBA1C 5.9 (H) 09/03/2020         Assessment & Plan:   Physical exam: Screening blood work  ordered Exercise  walking, water aerobics Weight  obese - working on weight loss Substance abuse  none   Reviewed recommended immunizations.   Health Maintenance  Topic Date Due   Pneumococcal Vaccine 2-23 Years old (1 of 2 - PCV) Never done   Colonoscopy  Never done   Cervical Cancer Screening (HPV/Pap  Cotest)  11/29/2015   Zoster Vaccines- Shingrix (1 of 2) Never done   COVID-19 Vaccine (1 - 2024-25 season) 12/12/2023 (Originally 02/07/2023)   INFLUENZA VACCINE  01/07/2024   MAMMOGRAM  09/30/2025   DTaP/Tdap/Td (3 - Td or Tdap) 03/09/2026   Hepatitis C Screening  Completed   HIV Screening  Completed   HPV VACCINES  Aged Out   Meningococcal B Vaccine  Aged Out          See Problem List for Assessment and Plan of chronic medical problems.

## 2023-11-26 ENCOUNTER — Encounter: Payer: Self-pay | Admitting: Internal Medicine

## 2023-11-26 ENCOUNTER — Ambulatory Visit (INDEPENDENT_AMBULATORY_CARE_PROVIDER_SITE_OTHER): Admitting: Internal Medicine

## 2023-11-26 VITALS — BP 136/78 | HR 76 | Temp 98.2°F | Ht 67.0 in | Wt 230.4 lb

## 2023-11-26 DIAGNOSIS — Z6841 Body Mass Index (BMI) 40.0 and over, adult: Secondary | ICD-10-CM

## 2023-11-26 DIAGNOSIS — E785 Hyperlipidemia, unspecified: Secondary | ICD-10-CM

## 2023-11-26 DIAGNOSIS — R7303 Prediabetes: Secondary | ICD-10-CM

## 2023-11-26 DIAGNOSIS — R21 Rash and other nonspecific skin eruption: Secondary | ICD-10-CM | POA: Insufficient documentation

## 2023-11-26 DIAGNOSIS — Z1211 Encounter for screening for malignant neoplasm of colon: Secondary | ICD-10-CM

## 2023-11-26 DIAGNOSIS — Z0000 Encounter for general adult medical examination without abnormal findings: Secondary | ICD-10-CM | POA: Diagnosis not present

## 2023-11-26 DIAGNOSIS — F17210 Nicotine dependence, cigarettes, uncomplicated: Secondary | ICD-10-CM

## 2023-11-26 DIAGNOSIS — B372 Candidiasis of skin and nail: Secondary | ICD-10-CM | POA: Insufficient documentation

## 2023-11-26 DIAGNOSIS — Z720 Tobacco use: Secondary | ICD-10-CM

## 2023-11-26 LAB — CBC WITH DIFFERENTIAL/PLATELET
Basophils Absolute: 0.1 10*3/uL (ref 0.0–0.1)
Basophils Relative: 1.6 % (ref 0.0–3.0)
Eosinophils Absolute: 0.2 10*3/uL (ref 0.0–0.7)
Eosinophils Relative: 3.1 % (ref 0.0–5.0)
HCT: 43.8 % (ref 36.0–46.0)
Hemoglobin: 13.7 g/dL (ref 12.0–15.0)
Lymphocytes Relative: 36.1 % (ref 12.0–46.0)
Lymphs Abs: 2.4 10*3/uL (ref 0.7–4.0)
MCHC: 31.2 g/dL (ref 30.0–36.0)
MCV: 68.5 fl — ABNORMAL LOW (ref 78.0–100.0)
Monocytes Absolute: 0.5 10*3/uL (ref 0.1–1.0)
Monocytes Relative: 6.9 % (ref 3.0–12.0)
Neutro Abs: 3.5 10*3/uL (ref 1.4–7.7)
Neutrophils Relative %: 52.3 % (ref 43.0–77.0)
Platelets: 297 10*3/uL (ref 150.0–400.0)
RBC: 6.4 Mil/uL — ABNORMAL HIGH (ref 3.87–5.11)
RDW: 15.8 % — ABNORMAL HIGH (ref 11.5–15.5)
WBC: 6.7 10*3/uL (ref 4.0–10.5)

## 2023-11-26 LAB — COMPREHENSIVE METABOLIC PANEL WITH GFR
ALT: 12 U/L (ref 0–35)
AST: 15 U/L (ref 0–37)
Albumin: 4 g/dL (ref 3.5–5.2)
Alkaline Phosphatase: 76 U/L (ref 39–117)
BUN: 16 mg/dL (ref 6–23)
CO2: 30 meq/L (ref 19–32)
Calcium: 9.2 mg/dL (ref 8.4–10.5)
Chloride: 105 meq/L (ref 96–112)
Creatinine, Ser: 0.85 mg/dL (ref 0.40–1.20)
GFR: 77.73 mL/min (ref >60.00)
Glucose, Bld: 71 mg/dL (ref 70–99)
Potassium: 4.1 meq/L (ref 3.5–5.1)
Sodium: 142 meq/L (ref 135–145)
Total Bilirubin: 0.3 mg/dL (ref 0.2–1.2)
Total Protein: 7.7 g/dL (ref 6.0–8.3)

## 2023-11-26 LAB — HEMOGLOBIN A1C: Hgb A1c MFr Bld: 6.1 % (ref 4.6–6.5)

## 2023-11-26 LAB — LIPID PANEL
Cholesterol: 168 mg/dL (ref 0–200)
HDL: 39.5 mg/dL (ref >39.00)
LDL Cholesterol: 115 mg/dL — ABNORMAL HIGH (ref 0–99)
NonHDL: 128.45
Total CHOL/HDL Ratio: 4
Triglycerides: 67 mg/dL (ref 0.0–149.0)
VLDL: 13.4 mg/dL (ref 0.0–40.0)

## 2023-11-26 LAB — TSH: TSH: 0.75 u[IU]/mL (ref 0.35–5.50)

## 2023-11-26 MED ORDER — FLUTICASONE PROPIONATE 50 MCG/ACT NA SUSP: 2.0000 | Freq: Every day | NASAL | 8 refills | Status: AC | PRN

## 2023-11-26 MED ORDER — IBUPROFEN 800 MG PO TABS: 800.0000 mg | ORAL_TABLET | Freq: Three times a day (TID) | ORAL | 3 refills | Status: AC | PRN

## 2023-11-26 MED ORDER — TRIAMCINOLONE ACETONIDE 0.1 % EX CREA: 1.0000 | TOPICAL_CREAM | Freq: Two times a day (BID) | CUTANEOUS | 5 refills | Status: AC

## 2023-11-26 MED ORDER — CLOTRIMAZOLE 1 % EX CREA: 1.0000 | TOPICAL_CREAM | Freq: Two times a day (BID) | CUTANEOUS | 5 refills | Status: AC

## 2023-11-26 NOTE — Assessment & Plan Note (Addendum)
 Subacute Under her panus - related to weight loss Has had this several times Will give clotrimazole cream Can use otc powder for prevention

## 2023-11-26 NOTE — Assessment & Plan Note (Addendum)
 Encouraged cessation.

## 2023-11-26 NOTE — Assessment & Plan Note (Signed)
 Subacute Left elbow - has had it on both elbows Itchy bumps Trial of triamcinolone cream

## 2023-11-26 NOTE — Assessment & Plan Note (Signed)
 Chronic Lab Results  Component Value Date   HGBA1C 5.9 (H) 09/03/2020   Check A1c Low sugar / carb diet Stressed regular exercise Continue weight loss efforts

## 2023-11-26 NOTE — Assessment & Plan Note (Addendum)
 Chronic Working on weight loss Has lost over 70 pounds Currently on Wegovy  2.4 mg weekly and tolerating medication well She is exercising regularly She is eating healthy and eating high-protein diet She is keeping up with her fluids. We need to continue Wegovy  to help her maintain weight and hopefully continue with weight loss efforts

## 2023-11-26 NOTE — Assessment & Plan Note (Signed)
 Chronic Check lipid panel, CMP, TSH, CBC Continue lifestyle control Regular exercise and healthy diet encouraged Working on weight loss

## 2023-11-27 ENCOUNTER — Ambulatory Visit: Payer: Self-pay | Admitting: Internal Medicine

## 2023-12-13 ENCOUNTER — Encounter: Payer: Self-pay | Admitting: Internal Medicine

## 2023-12-15 ENCOUNTER — Other Ambulatory Visit: Payer: Self-pay | Admitting: Internal Medicine

## 2023-12-29 ENCOUNTER — Telehealth: Payer: Self-pay | Admitting: Internal Medicine

## 2023-12-29 NOTE — Telephone Encounter (Unsigned)
 Copied from CRM #[id number]. Topic: General - Other >> Dec 29, 2023 12:26 PM Jasmin G wrote: Reason for CRM: [employer] called in behalf of pt to see if water aerobics would be covered for her, the person that I talked to told me that she's aware that it is an exclusion most times but that in certain cases it can be covered under some breach and needs permission from PCP. Please call pt back ASAP.

## 2023-12-30 NOTE — Telephone Encounter (Signed)
 Spoke with patient today and CPT codes given to see if she could get therapy approved.

## 2024-01-12 ENCOUNTER — Encounter: Payer: Self-pay | Admitting: Plastic Surgery

## 2024-01-12 ENCOUNTER — Ambulatory Visit: Admitting: Plastic Surgery

## 2024-01-12 VITALS — BP 151/92 | HR 78 | Ht 67.0 in | Wt 230.4 lb

## 2024-01-12 DIAGNOSIS — M793 Panniculitis, unspecified: Secondary | ICD-10-CM | POA: Diagnosis not present

## 2024-01-12 DIAGNOSIS — E65 Localized adiposity: Secondary | ICD-10-CM | POA: Diagnosis not present

## 2024-01-12 DIAGNOSIS — L987 Excessive and redundant skin and subcutaneous tissue: Secondary | ICD-10-CM

## 2024-01-12 NOTE — Progress Notes (Signed)
 Referring Provider Geofm Glade PARAS, MD [address] [place],  [place] [address]   CC:  Chief Complaint  Patient presents with   Advice Only      Michaela Carr Carr is an 53 y.o. female.  HPI: Michaela Carr Carr is a 53 year old female who presents today with complaints of excess skin on the lower portion of the abdomen after an 80 pound weight loss which she sustained due to a lap band in 2010 and use of Wegovy  over the past year.  She states that she frequently has rashes on the posterior aspect of this skin and in the intertriginous regions she notes also that there is a foul odor from underneath the pannus.  Is difficult for her to find clothes which fit appropriately and the pannus often interferes with daily activities.  She would like to have this excess skin and fat surgically removed.  No Known Allergies  Outpatient Encounter Medications as of 01/12/2024  Medication Sig   clotrimazole  (CLOTRIMAZOLE  ANTI-FUNGAL) 1 % cream Apply 1 Application topically 2 (two) times daily.   cyclobenzaprine  (FLEXERIL ) 10 MG tablet Take 1 tablet (10 mg total) by mouth at bedtime.   fluticasone  (FLONASE ) 50 MCG/ACT nasal spray Place 2 sprays into both nostrils daily as needed for allergies or rhinitis.   ibuprofen  (ADVIL ) 800 MG tablet Take 1 tablet (800 mg total) by mouth every 8 (eight) hours as needed for mild pain (pain score 1-3).   Semaglutide -Weight Management (WEGOVY ) 2.4 MG/0.75ML SOAJ Inject 2.4 mg into the skin once a week.   triamcinolone  cream (KENALOG ) 0.1 % Apply 1 Application topically 2 (two) times daily. For rash on ebows   No facility-administered encounter medications on file as of 01/12/2024.     Past Medical History:  Diagnosis Date   Back pain    no current problem   Joint pain    no current problem   Lower extremity edema    no current problem   Sciatica    pain   Sleep apnea    does not use cpap   STD (sexually transmitted disease)    trichomonas    Past Surgical History:   Procedure Laterality Date   ABLATION     gyn - fibroids   BREAST SURGERY  1997   breast reduction   LAPAROSCOPIC GASTRIC BANDING  2010   done by [facility]   ORIF ANKLE FRACTURE Right 08/09/2021   Procedure: OPEN REDUCTION INTERNAL FIXATION (ORIF) ANKLE FRACTURE possible syndesmosis fixation;  Surgeon: Barton Drape, MD;  Location: [facility] OR;  Service: Orthopedics;  Laterality: Right;   WISDOM TOOTH EXTRACTION      Family History  Problem Relation Age of Onset   Hyperlipidemia Mother    Hypertension Father    Hyperlipidemia Father    Alcoholism Father    Diabetes Maternal Grandmother     Social History   Social History Narrative   Not on file     Review of Systems General: Denies fevers, chills, weight loss CV: Denies chest pain, shortness of breath, palpitations Abdomen: Excess skin and fat which interferes with daily activities and frequently becomes infected on the posterior aspect of the pannus  Physical Exam    01/12/2024    9:27 AM 11/26/2023   12:57 PM 03/10/2023    3:56 PM  Vitals with BMI  Height 5' 7 5' 7 5' 7  Weight 230 lbs 6 oz 230 lbs 6 oz 242 lbs  BMI 36.08 36.08 37.89  Systolic  151 136 128  Diastolic 92 78 72  Pulse 78 76 70    General:  No acute distress,  Alert and oriented, Non-Toxic, Normal speech and affect Abdomen: Patient has a moderate pannus which extends down past the symphysis pubis and in the hairbearing area as well as past the inguinal crease onto the upper thighs.  She does not have any rashes today but she does have discoloration on the posterior pannus consistent with previous infections.  This is documented in her photographs. Mammogram: Mammogram in April 2025 was BI-RADS 1 Assessment/Plan Abdomen: Patient has a moderate pannus likely benefit from a panniculectomy.  We discussed panniculectomy's at length.  I showed her the location of the incision and we discussed the unpredictable nature of scarring and wound healing.  We  discussed the fact that in patients who have had significant weight loss had not had rashes that there is an increased risk of superficial wound healing complications and wound infection.  This risk in the literature approaches 80%.  This is typically treated with local wound care and dressing changes.  We discussed the risks of bleeding, infection, and seroma formation.  She understands she will have drains postoperatively and these will be in place for 1 to 4 weeks.  She will also need to wear a compressive garment for 6 weeks.  We discussed the postoperative limitations which include no heavy lifting greater than 20 pounds, no vigorous activity, and no submerging incisions in water for 6 weeks.  She may return to light activity as tolerated and she will be encouraged to begin ambulation as soon as possible after surgery to help decrease the risk of DVT.  All questions were answered to her satisfaction.  Photographs were obtained today with her consent.  Will submit her for a panniculectomy at her request.  [name] 01/12/2024, 10:20 AM

## 2024-02-01 ENCOUNTER — Encounter: Payer: Self-pay | Admitting: Internal Medicine

## 2024-02-18 ENCOUNTER — Telehealth: Payer: Self-pay | Admitting: Plastic Surgery

## 2024-02-18 NOTE — Telephone Encounter (Signed)
 Sent MyChart message to patient to stop the Wagovy injections 10 days prior to surgery. Reminder her she has a Pre-OP visit on 03/03/2024 at 2:00pm with [facility]. Patient can discuss any further questions she may have.

## 2024-02-18 NOTE — Telephone Encounter (Signed)
 Pt called and asked when she needs to quit taking her semaglutide -Weight Management (WEGOVY ) before sx, I let pt know they will also go over more infor mation on her preop on 9-26 and her sx is 03-17-24, Does anyone need to call her?

## 2024-02-21 ENCOUNTER — Encounter: Payer: Self-pay | Admitting: Internal Medicine

## 2024-03-03 ENCOUNTER — Ambulatory Visit (INDEPENDENT_AMBULATORY_CARE_PROVIDER_SITE_OTHER): Admitting: Student

## 2024-03-03 VITALS — BP 137/82 | HR 6 | Ht 67.0 in | Wt 228.5 lb

## 2024-03-03 DIAGNOSIS — Z01818 Encounter for other preprocedural examination: Secondary | ICD-10-CM

## 2024-03-03 DIAGNOSIS — Z719 Counseling, unspecified: Secondary | ICD-10-CM

## 2024-03-03 NOTE — H&P (View-Only) (Signed)
 Patient ID: Michaela Carr, female    DOB: Jun 29, 1970, 53 y.o.   MRN: [id number]  Chief Complaint  Patient presents with   Pre-op Exam      ICD-10-CM   1. Preoperative testing  Z01.818 Nicotine/cotinine metabolites       History of Present Illness: Michaela Carr is a 53 y.o.  female  with a history of panniculitis.  She presents for preoperative evaluation for upcoming procedure, panniculectomy, scheduled for 03/17/2024 with Dr. Waddell.  The patient has not had problems with anesthesia.  Patient denies any history of cardiac disease.  She denies taking any blood thinners.  Patient does report that she stopped vaping about 3 weeks ago, but did vape last week.  She does state that the vape does contain nicotine products.  Patient denies taking any birth control or hormone replacement.  She denies any history of miscarriages.  She denies any personal or family history of blood clots or clotting diseases.  She denies any recent surgeries, traumas or infections.  She denies any history of stroke or heart attack.  She denies any history of Crohn's disease or ulcerative colitis, COPD or asthma.  She denies any history of cancer.  She denies any varicosities to her lower extremities.  She denies any recent fevers, chills or changes in her health.  Summary of Previous Visit: Patient was seen for initial consult by Dr. Waddell on 01/12/2024.  At this visit, patient reported excess skin to the lower portion of her abdomen after 80 pound weight loss which she sustained due to a Lap-Band in 2010 and use of Wegovy  over the past year.  Patient reported rashes on the posterior aspect of the skin in the intertriginous regions.  Patient wanted to have this excess skin and fat surgically removed.  Plan was to move forward with panniculectomy.  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[employer], planning to take 3 weeks off  PMH Significant for: Sciatica, macromastia   Past Medical History: Allergies: No Known  Allergies  Current Medications:  Current Outpatient Medications:    clotrimazole  (CLOTRIMAZOLE  ANTI-FUNGAL) 1 % cream, Apply 1 Application topically 2 (two) times daily., Disp: 60 g, Rfl: 5   cyclobenzaprine  (FLEXERIL ) 10 MG tablet, Take 1 tablet (10 mg total) by mouth at bedtime., Disp: 20 tablet, Rfl: 1   fluticasone  (FLONASE ) 50 MCG/ACT nasal spray, Place 2 sprays into both nostrils daily as needed for allergies or rhinitis., Disp: 9.9 mL, Rfl: 8   ibuprofen  (ADVIL ) 800 MG tablet, Take 1 tablet (800 mg total) by mouth every 8 (eight) hours as needed for mild pain (pain score 1-3)., Disp: 30 tablet, Rfl: 3   Semaglutide -Weight Management (WEGOVY ) 2.4 MG/0.75ML SOAJ, Inject 2.4 mg into the skin once a week., Disp: 4 mL, Rfl: 2   triamcinolone  cream (KENALOG ) 0.1 %, Apply 1 Application topically 2 (two) times daily. For rash on ebows, Disp: 30 g, Rfl: 5  Past Medical Problems: Past Medical History:  Diagnosis Date   Back pain    no current problem   Joint pain    no current problem   Lower extremity edema    no current problem   Sciatica    pain   Sleep apnea    does not use cpap   STD (sexually transmitted disease)    trichomonas    Past Surgical History: Past Surgical History:  Procedure Laterality Date   ABLATION     gyn - fibroids   BREAST SURGERY  1997   breast reduction   LAPAROSCOPIC GASTRIC BANDING  2010   done by [facility]   ORIF ANKLE FRACTURE Right 08/09/2021   Procedure: OPEN REDUCTION INTERNAL FIXATION (ORIF) ANKLE FRACTURE possible syndesmosis fixation;  Surgeon: Barton Drape, MD;  Location: [facility] OR;  Service: Orthopedics;  Laterality: Right;   WISDOM TOOTH EXTRACTION      Social History: Social History   Socioeconomic History   Marital status: Married    Spouse name: Not on file   Number of children: Not on file   Years of education: Not on file   Highest education level: Not on file  Occupational History   Occupation: terster/inspector   Tobacco Use   Smoking status: Every Day    Current packs/day: 0.50    Average packs/day: 0.5 packs/day for 30.0 years (15.0 ttl pk-yrs)    Types: Cigarettes   Smokeless tobacco: Never   Tobacco comments:    Pt. Already has information on quiting  Vaping Use   Vaping status: Never Used  Substance and Sexual Activity   Alcohol use: Yes    Comment: social wine   Drug use: No   Sexual activity: Yes    Birth control/protection: None  Other Topics Concern   Not on file  Social History Narrative   Not on file   Social Drivers of Health   Financial Resource Strain: Not on file  Food Insecurity: Not on file  Transportation Needs: Not on file  Physical Activity: Not on file  Stress: Not on file (04/15/2023)  Social Connections: Not on file  Intimate Partner Violence: Not on file    Family History: Family History  Problem Relation Age of Onset   Hyperlipidemia Mother    Hypertension Father    Hyperlipidemia Father    Alcoholism Father    Diabetes Maternal Grandmother     Review of Systems: Denies any recent fevers, chills or changes in her health  Physical Exam: Vital Signs BP 137/82 (BP Location: Left Arm, Patient Position: Sitting, Cuff Size: Large)   Pulse (!) 6   Ht 5' 7 (1.702 m)   Wt 228 lb 8 oz (103.6 kg)   SpO2 100%   BMI 35.79 kg/m   Physical Exam  Constitutional:      General: Not in acute distress.    Appearance: Normal appearance. Not ill-appearing.  HENT:     Head: Normocephalic and atraumatic.  Neck:     Musculoskeletal: Normal range of motion.  Cardiovascular:     Rate and Rhythm: Normal rate Pulmonary:     Effort: Pulmonary effort is normal. No respiratory distress.  Musculoskeletal: Normal range of motion.  Skin:    General: Skin is warm and dry.     Findings: No erythema or rash.  Neurological:     Mental Status: Alert and oriented to person, place, and time. Mental status is at baseline.  Psychiatric:        Mood and Affect: Mood  normal.        Behavior: Behavior normal.    Assessment/Plan: The patient is scheduled for panniculectomy with Dr. Waddell.  Risks, benefits, and alternatives of procedure discussed, questions answered and consent obtained.    Smoking Status: Vapes; Counseling Given?  Yes, discussed with the patient that nicotine products can greatly inhibit her wound healing.  I discussed with her that she needs to stop vaping now and that her current vaping will most likely delay her surgery.  I will discuss with Dr. Waddell  though.  We will also go ahead and get a nicotine test for her as well.  Discussed with her that she should go about a week before surgery to have this done regardless if her surgery is delayed or not.  She expressed understanding.  Caprini Score: 5; Risk Factors include: Age, BMI > 25, history of smoking and length of planned surgery. Recommendation for mechanical prophylaxis. Encourage early ambulation.   Pictures obtained: @consult   Post-op Rx sent to pharmacy: Will hold off on sending medications and for now until surgery days confirmed given history of smoking  Patient was provided with the General Surgical Risk consent document and Pain Medication Agreement prior to their appointment.  They had adequate time to read through the risk consent documents and Pain Medication Agreement. We also discussed them in person together during this preop appointment. All of their questions were answered to their satisfaction.  Recommended calling if they have any further questions.  Risk consent form and Pain Medication Agreement to be scanned into patient's chart.  The consent was obtained with risks and complications reviewed which included bleeding, pain, scar, infection and the risk of anesthesia.  The patients questions were answered to the patients expressed satisfaction.    Electronically signed by: Estefana FORBES Peck, PA-C 03/03/2024 4:30 PM

## 2024-03-03 NOTE — Progress Notes (Signed)
 Patient ID: Michaela Carr, female    DOB: Jun 29, 1970, 53 y.o.   MRN: [id number]  Chief Complaint  Patient presents with   Pre-op Exam      ICD-10-CM   1. Preoperative testing  Z01.818 Nicotine/cotinine metabolites       History of Present Illness: Michaela Carr is a 53 y.o.  female  with a history of panniculitis.  She presents for preoperative evaluation for upcoming procedure, panniculectomy, scheduled for 03/17/2024 with Dr. Waddell.  The patient has not had problems with anesthesia.  Patient denies any history of cardiac disease.  She denies taking any blood thinners.  Patient does report that she stopped vaping about 3 weeks ago, but did vape last week.  She does state that the vape does contain nicotine products.  Patient denies taking any birth control or hormone replacement.  She denies any history of miscarriages.  She denies any personal or family history of blood clots or clotting diseases.  She denies any recent surgeries, traumas or infections.  She denies any history of stroke or heart attack.  She denies any history of Crohn's disease or ulcerative colitis, COPD or asthma.  She denies any history of cancer.  She denies any varicosities to her lower extremities.  She denies any recent fevers, chills or changes in her health.  Summary of Previous Visit: Patient was seen for initial consult by Dr. Waddell on 01/12/2024.  At this visit, patient reported excess skin to the lower portion of her abdomen after 80 pound weight loss which she sustained due to a Lap-Band in 2010 and use of Wegovy  over the past year.  Patient reported rashes on the posterior aspect of the skin in the intertriginous regions.  Patient wanted to have this excess skin and fat surgically removed.  Plan was to move forward with panniculectomy.  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[employer], planning to take 3 weeks off  PMH Significant for: Sciatica, macromastia   Past Medical History: Allergies: No Known  Allergies  Current Medications:  Current Outpatient Medications:    clotrimazole  (CLOTRIMAZOLE  ANTI-FUNGAL) 1 % cream, Apply 1 Application topically 2 (two) times daily., Disp: 60 g, Rfl: 5   cyclobenzaprine  (FLEXERIL ) 10 MG tablet, Take 1 tablet (10 mg total) by mouth at bedtime., Disp: 20 tablet, Rfl: 1   fluticasone  (FLONASE ) 50 MCG/ACT nasal spray, Place 2 sprays into both nostrils daily as needed for allergies or rhinitis., Disp: 9.9 mL, Rfl: 8   ibuprofen  (ADVIL ) 800 MG tablet, Take 1 tablet (800 mg total) by mouth every 8 (eight) hours as needed for mild pain (pain score 1-3)., Disp: 30 tablet, Rfl: 3   Semaglutide -Weight Management (WEGOVY ) 2.4 MG/0.75ML SOAJ, Inject 2.4 mg into the skin once a week., Disp: 4 mL, Rfl: 2   triamcinolone  cream (KENALOG ) 0.1 %, Apply 1 Application topically 2 (two) times daily. For rash on ebows, Disp: 30 g, Rfl: 5  Past Medical Problems: Past Medical History:  Diagnosis Date   Back pain    no current problem   Joint pain    no current problem   Lower extremity edema    no current problem   Sciatica    pain   Sleep apnea    does not use cpap   STD (sexually transmitted disease)    trichomonas    Past Surgical History: Past Surgical History:  Procedure Laterality Date   ABLATION     gyn - fibroids   BREAST SURGERY  1997   breast reduction   LAPAROSCOPIC GASTRIC BANDING  2010   done by [facility]   ORIF ANKLE FRACTURE Right 08/09/2021   Procedure: OPEN REDUCTION INTERNAL FIXATION (ORIF) ANKLE FRACTURE possible syndesmosis fixation;  Surgeon: Barton Drape, MD;  Location: [facility] OR;  Service: Orthopedics;  Laterality: Right;   WISDOM TOOTH EXTRACTION      Social History: Social History   Socioeconomic History   Marital status: Married    Spouse name: Not on file   Number of children: Not on file   Years of education: Not on file   Highest education level: Not on file  Occupational History   Occupation: terster/inspector   Tobacco Use   Smoking status: Every Day    Current packs/day: 0.50    Average packs/day: 0.5 packs/day for 30.0 years (15.0 ttl pk-yrs)    Types: Cigarettes   Smokeless tobacco: Never   Tobacco comments:    Pt. Already has information on quiting  Vaping Use   Vaping status: Never Used  Substance and Sexual Activity   Alcohol use: Yes    Comment: social wine   Drug use: No   Sexual activity: Yes    Birth control/protection: None  Other Topics Concern   Not on file  Social History Narrative   Not on file   Social Drivers of Health   Financial Resource Strain: Not on file  Food Insecurity: Not on file  Transportation Needs: Not on file  Physical Activity: Not on file  Stress: Not on file (04/15/2023)  Social Connections: Not on file  Intimate Partner Violence: Not on file    Family History: Family History  Problem Relation Age of Onset   Hyperlipidemia Mother    Hypertension Father    Hyperlipidemia Father    Alcoholism Father    Diabetes Maternal Grandmother     Review of Systems: Denies any recent fevers, chills or changes in her health  Physical Exam: Vital Signs BP 137/82 (BP Location: Left Arm, Patient Position: Sitting, Cuff Size: Large)   Pulse (!) 6   Ht 5' 7 (1.702 m)   Wt 228 lb 8 oz (103.6 kg)   SpO2 100%   BMI 35.79 kg/m   Physical Exam  Constitutional:      General: Not in acute distress.    Appearance: Normal appearance. Not ill-appearing.  HENT:     Head: Normocephalic and atraumatic.  Neck:     Musculoskeletal: Normal range of motion.  Cardiovascular:     Rate and Rhythm: Normal rate Pulmonary:     Effort: Pulmonary effort is normal. No respiratory distress.  Musculoskeletal: Normal range of motion.  Skin:    General: Skin is warm and dry.     Findings: No erythema or rash.  Neurological:     Mental Status: Alert and oriented to person, place, and time. Mental status is at baseline.  Psychiatric:        Mood and Affect: Mood  normal.        Behavior: Behavior normal.    Assessment/Plan: The patient is scheduled for panniculectomy with Dr. Waddell.  Risks, benefits, and alternatives of procedure discussed, questions answered and consent obtained.    Smoking Status: Vapes; Counseling Given?  Yes, discussed with the patient that nicotine products can greatly inhibit her wound healing.  I discussed with her that she needs to stop vaping now and that her current vaping will most likely delay her surgery.  I will discuss with Dr. Waddell  though.  We will also go ahead and get a nicotine test for her as well.  Discussed with her that she should go about a week before surgery to have this done regardless if her surgery is delayed or not.  She expressed understanding.  Caprini Score: 5; Risk Factors include: Age, BMI > 25, history of smoking and length of planned surgery. Recommendation for mechanical prophylaxis. Encourage early ambulation.   Pictures obtained: @consult   Post-op Rx sent to pharmacy: Will hold off on sending medications and for now until surgery days confirmed given history of smoking  Patient was provided with the General Surgical Risk consent document and Pain Medication Agreement prior to their appointment.  They had adequate time to read through the risk consent documents and Pain Medication Agreement. We also discussed them in person together during this preop appointment. All of their questions were answered to their satisfaction.  Recommended calling if they have any further questions.  Risk consent form and Pain Medication Agreement to be scanned into patient's chart.  The consent was obtained with risks and complications reviewed which included bleeding, pain, scar, infection and the risk of anesthesia.  The patients questions were answered to the patients expressed satisfaction.    Electronically signed by: Estefana FORBES Peck, PA-C 03/03/2024 4:30 PM

## 2024-03-06 ENCOUNTER — Telehealth: Payer: Self-pay | Admitting: Plastic Surgery

## 2024-03-06 NOTE — Telephone Encounter (Signed)
Returned call to schedule

## 2024-03-06 NOTE — Telephone Encounter (Signed)
 Spoke with Dr. Waddell, he stated that if patient gets a nicotine test and is negative, we can go ahead and proceed with surgery.  I called the patient and discussed this with her.  Recommended that she get the nicotine test later this week.  She expressed understanding.

## 2024-03-10 ENCOUNTER — Encounter (HOSPITAL_BASED_OUTPATIENT_CLINIC_OR_DEPARTMENT_OTHER): Payer: Self-pay | Admitting: Plastic Surgery

## 2024-03-10 ENCOUNTER — Other Ambulatory Visit: Payer: Self-pay

## 2024-03-13 ENCOUNTER — Other Ambulatory Visit: Payer: Self-pay | Admitting: Internal Medicine

## 2024-03-13 DIAGNOSIS — Z01818 Encounter for other preprocedural examination: Secondary | ICD-10-CM | POA: Diagnosis not present

## 2024-03-15 NOTE — Telephone Encounter (Signed)
 I called the patient to let her know that Michaela Carr and I have been working on getting her lab results from [facility]. I let her know that we put a high priority ticket in for her results to be read. I did also inform her that if we do not have the results by tomorrow surgery may be delayed. Patient conveyed understanding.

## 2024-03-17 ENCOUNTER — Ambulatory Visit (HOSPITAL_BASED_OUTPATIENT_CLINIC_OR_DEPARTMENT_OTHER): Admission: RE | Admit: 2024-03-17 | Source: Home / Self Care | Admitting: Plastic Surgery

## 2024-03-17 DIAGNOSIS — Z01818 Encounter for other preprocedural examination: Secondary | ICD-10-CM

## 2024-03-17 LAB — NICOTINE/COTININE METABOLITES
Cotinine: 1.2 ng/mL
Nicotine: <1 ng/mL

## 2024-03-17 SURGERY — PANNICULECTOMY
Anesthesia: Choice

## 2024-03-20 ENCOUNTER — Encounter (HOSPITAL_BASED_OUTPATIENT_CLINIC_OR_DEPARTMENT_OTHER): Payer: Self-pay | Admitting: Plastic Surgery

## 2024-03-20 ENCOUNTER — Other Ambulatory Visit: Payer: Self-pay

## 2024-03-20 NOTE — Telephone Encounter (Signed)
 Results are negative, she's good to go. Can she be added ASAP? Perhaps Friday?

## 2024-03-21 ENCOUNTER — Ambulatory Visit: Payer: Self-pay | Admitting: Student

## 2024-03-21 NOTE — Progress Notes (Signed)
 Good Morning,   Nicotine and Cotinine are negative. Please reschedule patient's panniculectomy   Thanks, Estefana

## 2024-03-21 NOTE — H&P (View-Only) (Signed)
 Good Morning,   Nicotine and Cotinine are negative. Please reschedule patient's panniculectomy   Thanks, Estefana

## 2024-03-22 ENCOUNTER — Other Ambulatory Visit: Payer: Self-pay | Admitting: Student

## 2024-03-22 MED ORDER — ONDANSETRON HCL 4 MG PO TABS: 4.0000 mg | ORAL_TABLET | Freq: Three times a day (TID) | ORAL | 0 refills | Status: AC | PRN

## 2024-03-22 MED ORDER — CHLORHEXIDINE GLUCONATE CLOTH 2 % EX PADS: 6.0000 | MEDICATED_PAD | Freq: Once | CUTANEOUS | Status: DC

## 2024-03-22 MED ORDER — OXYCODONE HCL 5 MG PO TABS: 5.0000 mg | ORAL_TABLET | Freq: Four times a day (QID) | ORAL | 0 refills | Status: AC | PRN

## 2024-03-22 NOTE — Progress Notes (Signed)
 Patient's nicotine test came back negative, and patient  was rescheduled for her panniculectomy surgery this upcoming Friday, 03/24/2024.  Will send in postop pain medications.  Patient states that she is okay with the oxycodone .  Discussed with her that she can pick these up prior to surgery, but she is not to take anything until after surgery.  She expressed understanding of this.

## 2024-03-22 NOTE — Progress Notes (Signed)

## 2024-03-23 ENCOUNTER — Encounter: Admitting: Physician Assistant

## 2024-03-24 ENCOUNTER — Other Ambulatory Visit: Payer: Self-pay

## 2024-03-24 ENCOUNTER — Ambulatory Visit (HOSPITAL_BASED_OUTPATIENT_CLINIC_OR_DEPARTMENT_OTHER): Admitting: Certified Registered"

## 2024-03-24 ENCOUNTER — Encounter (HOSPITAL_BASED_OUTPATIENT_CLINIC_OR_DEPARTMENT_OTHER)
Admission: RE | Disposition: A | Discharge status: Home / Self Care | Payer: Self-pay | Source: Home / Self Care | Attending: Plastic Surgery

## 2024-03-24 ENCOUNTER — Encounter (HOSPITAL_BASED_OUTPATIENT_CLINIC_OR_DEPARTMENT_OTHER): Payer: Self-pay | Admitting: Plastic Surgery

## 2024-03-24 ENCOUNTER — Ambulatory Visit (HOSPITAL_BASED_OUTPATIENT_CLINIC_OR_DEPARTMENT_OTHER)
Admission: RE | Admit: 2024-03-24 | Discharge: 2024-03-24 | Disposition: A | Discharge status: Home / Self Care | Attending: Plastic Surgery | Admitting: Plastic Surgery

## 2024-03-24 DIAGNOSIS — Z9884 Bariatric surgery status: Secondary | ICD-10-CM | POA: Insufficient documentation

## 2024-03-24 DIAGNOSIS — F17210 Nicotine dependence, cigarettes, uncomplicated: Secondary | ICD-10-CM | POA: Diagnosis not present

## 2024-03-24 DIAGNOSIS — R21 Rash and other nonspecific skin eruption: Secondary | ICD-10-CM | POA: Diagnosis not present

## 2024-03-24 DIAGNOSIS — M793 Panniculitis, unspecified: Secondary | ICD-10-CM | POA: Insufficient documentation

## 2024-03-24 DIAGNOSIS — Z01818 Encounter for other preprocedural examination: Secondary | ICD-10-CM

## 2024-03-24 DIAGNOSIS — G4730 Sleep apnea, unspecified: Secondary | ICD-10-CM | POA: Insufficient documentation

## 2024-03-24 HISTORY — PX: PANNICULECTOMY: SHX5360

## 2024-03-24 HISTORY — DX: Prediabetes: R73.03

## 2024-03-24 SURGERY — PANNICULECTOMY
Anesthesia: General | Site: Abdomen

## 2024-03-24 MED ORDER — CEFAZOLIN SODIUM-DEXTROSE 2-4 GM/100ML-% IV SOLN
2.0000 g | INTRAVENOUS | Status: AC
  Administered 2024-03-24: 2 g via INTRAVENOUS

## 2024-03-24 MED ORDER — FENTANYL CITRATE (PF) 100 MCG/2ML IJ SOLN
INTRAMUSCULAR | Status: AC
  Filled 2024-03-24: qty 2

## 2024-03-24 MED ORDER — CEFAZOLIN SODIUM-DEXTROSE 2-4 GM/100ML-% IV SOLN
INTRAVENOUS | Status: AC
  Filled 2024-03-24: qty 100

## 2024-03-24 MED ORDER — PHENYLEPHRINE HCL (PRESSORS) 10 MG/ML IV SOLN
INTRAVENOUS | Status: DC | PRN
  Administered 2024-03-24 (×3): 40 ug via INTRAVENOUS
  Administered 2024-03-24: 160 ug via INTRAVENOUS
  Administered 2024-03-24: 80 ug via INTRAVENOUS
  Administered 2024-03-24: 40 ug via INTRAVENOUS
  Administered 2024-03-24: 80 ug via INTRAVENOUS
  Administered 2024-03-24: 40 ug via INTRAVENOUS

## 2024-03-24 MED ORDER — ROCURONIUM BROMIDE 100 MG/10ML IV SOLN
INTRAVENOUS | Status: DC | PRN
  Administered 2024-03-24: 60 mg via INTRAVENOUS

## 2024-03-24 MED ORDER — ACETAMINOPHEN 10 MG/ML IV SOLN
1000.0000 mg | Freq: Once | INTRAVENOUS | Status: DC | PRN
  Administered 2024-03-24: 1000 mg via INTRAVENOUS

## 2024-03-24 MED ORDER — LIDOCAINE HCL (CARDIAC) PF 100 MG/5ML IV SOSY
PREFILLED_SYRINGE | INTRAVENOUS | Status: DC | PRN
  Administered 2024-03-24: 100 mg via INTRAVENOUS

## 2024-03-24 MED ORDER — ROCURONIUM BROMIDE 10 MG/ML (PF) SYRINGE
PREFILLED_SYRINGE | INTRAVENOUS | Status: AC
  Filled 2024-03-24: qty 10

## 2024-03-24 MED ORDER — SUGAMMADEX SODIUM 200 MG/2ML IV SOLN
INTRAVENOUS | Status: DC | PRN
  Administered 2024-03-24: 200 mg via INTRAVENOUS

## 2024-03-24 MED ORDER — FENTANYL CITRATE (PF) 100 MCG/2ML IJ SOLN
25.0000 ug | INTRAMUSCULAR | Status: DC | PRN
  Administered 2024-03-24 (×2): 50 ug via INTRAVENOUS

## 2024-03-24 MED ORDER — BSS IO SOLN: 15.0000 mL | Freq: Once | INTRAOCULAR | Status: DC

## 2024-03-24 MED ORDER — DROPERIDOL 2.5 MG/ML IJ SOLN: 0.6250 mg | Freq: Once | INTRAMUSCULAR | Status: DC | PRN

## 2024-03-24 MED ORDER — KETOROLAC TROMETHAMINE 0.5 % OP SOLN
1.0000 [drp] | Freq: Four times a day (QID) | OPHTHALMIC | Status: DC
  Administered 2024-03-24: 1 [drp] via OPHTHALMIC

## 2024-03-24 MED ORDER — MIDAZOLAM HCL 2 MG/2ML IJ SOLN
INTRAMUSCULAR | Status: AC
  Filled 2024-03-24: qty 2

## 2024-03-24 MED ORDER — 0.9 % SODIUM CHLORIDE (POUR BTL) OPTIME
TOPICAL | Status: DC | PRN
  Administered 2024-03-24: 1000 mL

## 2024-03-24 MED ORDER — ONDANSETRON HCL 4 MG/2ML IJ SOLN
INTRAMUSCULAR | Status: DC | PRN
  Administered 2024-03-24: 4 mg via INTRAVENOUS

## 2024-03-24 MED ORDER — LACTATED RINGERS IV SOLN: INTRAVENOUS | Status: DC

## 2024-03-24 MED ORDER — SODIUM CHLORIDE (PF) 0.9 % IJ SOLN
INTRAMUSCULAR | Status: DC | PRN
  Administered 2024-03-24: 100 mL

## 2024-03-24 MED ORDER — DEXAMETHASONE SODIUM PHOSPHATE 4 MG/ML IJ SOLN
INTRAMUSCULAR | Status: DC | PRN
  Administered 2024-03-24: 5 mg via INTRAVENOUS

## 2024-03-24 MED ORDER — ACETAMINOPHEN 10 MG/ML IV SOLN
INTRAVENOUS | Status: AC
  Filled 2024-03-24: qty 100

## 2024-03-24 MED ORDER — OXYCODONE HCL 5 MG/5ML PO SOLN: 5.0000 mg | Freq: Once | ORAL | Status: AC | PRN

## 2024-03-24 MED ORDER — EPHEDRINE 5 MG/ML INJ
INTRAVENOUS | Status: AC
  Filled 2024-03-24: qty 5

## 2024-03-24 MED ORDER — EPHEDRINE SULFATE (PRESSORS) 50 MG/ML IJ SOLN
INTRAMUSCULAR | Status: DC | PRN
  Administered 2024-03-24 (×2): 5 mg via INTRAVENOUS

## 2024-03-24 MED ORDER — FENTANYL CITRATE (PF) 100 MCG/2ML IJ SOLN
INTRAMUSCULAR | Status: DC | PRN
  Administered 2024-03-24: 100 ug via INTRAVENOUS
  Administered 2024-03-24: 50 ug via INTRAVENOUS
  Administered 2024-03-24 (×2): 25 ug via INTRAVENOUS

## 2024-03-24 MED ORDER — OXYCODONE HCL 5 MG PO TABS
5.0000 mg | ORAL_TABLET | Freq: Once | ORAL | Status: AC | PRN
  Administered 2024-03-24: 5 mg via ORAL

## 2024-03-24 MED ORDER — OXYCODONE HCL 5 MG PO TABS
ORAL_TABLET | ORAL | Status: AC
  Filled 2024-03-24: qty 1

## 2024-03-24 MED ORDER — MIDAZOLAM HCL 5 MG/5ML IJ SOLN
INTRAMUSCULAR | Status: DC | PRN
  Administered 2024-03-24: 2 mg via INTRAVENOUS

## 2024-03-24 MED ADMIN — PROPOFOL 200 MG/20ML IV EMUL: 200 mg | INTRAVENOUS | NDC 00069020910

## 2024-03-24 MED FILL — Ondansetron HCl Inj 4 MG/2ML (2 MG/ML): INTRAMUSCULAR | Qty: 2 | Status: AC

## 2024-03-24 MED FILL — Ketorolac Tromethamine Ophth Soln 0.5%: OPHTHALMIC | Qty: 5 | Status: AC

## 2024-03-24 MED FILL — Propofol IV Emul 200 MG/20ML (10 MG/ML): INTRAVENOUS | Qty: 20 | Status: AC

## 2024-03-24 SURGICAL SUPPLY — 54 items
BINDER ABDOMINAL 12 ML 46-62 (SOFTGOODS) IMPLANT
BINDER ABDOMINAL 12 SM 30-45 (SOFTGOODS) IMPLANT
BIOPATCH RED 1 DISK 7.0 (GAUZE/BANDAGES/DRESSINGS) ×4 IMPLANT
BLADE CLIPPER SURG (BLADE) IMPLANT
BLADE SURG 10 STRL SS (BLADE) ×8 IMPLANT
BLADE SURG 11 STRL SS (BLADE) IMPLANT
BLADE SURG 15 STRL LF DISP TIS (BLADE) ×2 IMPLANT
CLIP APPLIE 9.375 MED OPEN (MISCELLANEOUS) IMPLANT
DERMABOND ADVANCED .7 DNX12 (GAUZE/BANDAGES/DRESSINGS) ×4 IMPLANT
DRAIN CHANNEL 19F RND (DRAIN) ×4 IMPLANT
DRAPE UTILITY XL STRL (DRAPES) ×2 IMPLANT
DRSG TEGADERM 4X4.75 (GAUZE/BANDAGES/DRESSINGS) ×4 IMPLANT
ELECTRODE BLDE 4.0 EZ CLN MEGD (MISCELLANEOUS) ×2 IMPLANT
ELECTRODE REM PT RTRN 9FT ADLT (ELECTROSURGICAL) ×4 IMPLANT
EVACUATOR SILICONE 100CC (DRAIN) ×4 IMPLANT
GAUZE PAD ABD 8X10 STRL (GAUZE/BANDAGES/DRESSINGS) ×4 IMPLANT
GAUZE SPONGE 2X2 STRL 8-PLY (GAUZE/BANDAGES/DRESSINGS) ×4 IMPLANT
GLOVE BIO SURGEON STRL SZ 6.5 (GLOVE) IMPLANT
GLOVE BIO SURGEON STRL SZ7.5 (GLOVE) IMPLANT
GLOVE BIO SURGEON STRL SZ8 (GLOVE) ×2 IMPLANT
GLOVE BIOGEL PI IND STRL 7.0 (GLOVE) IMPLANT
GLOVE BIOGEL PI IND STRL 8 (GLOVE) ×2 IMPLANT
GOWN STRL REUS W/ TWL LRG LVL3 (GOWN DISPOSABLE) ×2 IMPLANT
GOWN STRL REUS W/TWL XL LVL3 (GOWN DISPOSABLE) ×2 IMPLANT
HEMOSTAT ARISTA ABSORB 3G PWDR (HEMOSTASIS) IMPLANT
HIBICLENS CHG 4% 4OZ BTL (MISCELLANEOUS) ×2 IMPLANT
MANIFOLD NEPTUNE II (INSTRUMENTS) ×2 IMPLANT
NDL HYPO 22X1.5 SAFETY MO (MISCELLANEOUS) ×4 IMPLANT
NEEDLE HYPO 22X1.5 SAFETY MO (MISCELLANEOUS) ×2 IMPLANT
NS IRRIG 1000ML POUR BTL (IV SOLUTION) ×2 IMPLANT
PACK BASIN DAY SURGERY FS (CUSTOM PROCEDURE TRAY) ×2 IMPLANT
PACK UNIVERSAL I (CUSTOM PROCEDURE TRAY) ×2 IMPLANT
PENCIL SMOKE EVACUATOR (MISCELLANEOUS) ×4 IMPLANT
PIN SAFETY STERILE (MISCELLANEOUS) ×2 IMPLANT
SLEEVE SCD COMPRESS KNEE MED (STOCKING) ×2 IMPLANT
SPONGE T-LAP 18X18 ~~LOC~~+RFID (SPONGE) ×4 IMPLANT
STAPLER INSORB 30 2030 C-SECTI (MISCELLANEOUS) IMPLANT
STAPLER SKIN PROX WIDE 3.9 (STAPLE) ×2 IMPLANT
SUT MNCRL AB 3-0 PS2 27 (SUTURE) ×8 IMPLANT
SUT MNCRL AB 4-0 PS2 18 (SUTURE) ×4 IMPLANT
SUT PDS AB 0 CT1 27 (SUTURE) ×8 IMPLANT
SUT PDS AB 3-0 CT2 27 (SUTURE) IMPLANT
SUT SILK 2 0 SH (SUTURE) ×4 IMPLANT
SUT VIC AB 3-0 PS1 18XBRD (SUTURE) IMPLANT
SUT VIC AB 3-0 SH 27X BRD (SUTURE) ×2 IMPLANT
SUT VICRYL AB 3 0 TIES (SUTURE) IMPLANT
SYR 20ML LL LF (SYRINGE) ×4 IMPLANT
SYR BULB IRRIG 60ML STRL (SYRINGE) IMPLANT
SYR CONTROL 10ML LL (SYRINGE) ×2 IMPLANT
TOWEL GREEN STERILE FF (TOWEL DISPOSABLE) ×4 IMPLANT
TRAY DSU PREP LF (CUSTOM PROCEDURE TRAY) ×2 IMPLANT
TUBE CONNECTING 20X1/4 (TUBING) ×2 IMPLANT
UNDERPAD 30X36 HEAVY ABSORB (UNDERPADS AND DIAPERS) ×4 IMPLANT
YANKAUER SUCT BULB TIP NO VENT (SUCTIONS) ×2 IMPLANT

## 2024-03-24 NOTE — Discharge Instructions (Addendum)
 Post Anesthesia Home Care Instructions  Activity: Get plenty of rest for the remainder of the day. A responsible individual must stay with you for 24 hours following the procedure.  For the next 24 hours, DO NOT: -Drive a car -Advertising copywriter -Drink alcoholic beverages -Take any medication unless instructed by your physician -Make any legal decisions or sign important papers.  Meals: Start with liquid foods such as gelatin or soup. Progress to regular foods as tolerated. Avoid greasy, spicy, heavy foods. If nausea and/or vomiting occur, drink only clear liquids until the nausea and/or vomiting subsides. Call your physician if vomiting continues.  Special Instructions/Symptoms: Your throat may feel dry or sore from the anesthesia or the breathing tube placed in your throat during surgery. If this causes discomfort, gargle with warm salt water. The discomfort should disappear within 24 hours.  If you had a scopolamine patch placed behind your ear for the management of post- operative nausea and/or vomiting:  1. The medication in the patch is effective for 72 hours, after which it should be removed.  Wrap patch in a tissue and discard in the trash. Wash hands thoroughly with soap and water. 2. You may remove the patch earlier than 72 hours if you experience unpleasant side effects which may include dry mouth, dizziness or visual disturbances. 3. Avoid touching the patch. Wash your hands with soap and water after contact with the patch.   Information for Discharge Teaching: EXPAREL (bupivacaine liposome injectable suspension)   Pain relief is important to your recovery. The goal is to control your pain so you can move easier and return to your normal activities as soon as possible after your procedure. Your physician may use several types of medicines to manage pain, swelling, and more.  Your surgeon or anesthesiologist gave you EXPAREL(bupivacaine) to help control your pain after  surgery.  EXPAREL is a local anesthetic designed to release slowly over an extended period of time to provide pain relief by numbing the tissue around the surgical site. EXPAREL is designed to release pain medication over time and can control pain for up to 72 hours. Depending on how you respond to EXPAREL, you may require less pain medication during your recovery. EXPAREL can help reduce or eliminate the need for opioids during the first few days after surgery when pain relief is needed the most. EXPAREL is not an opioid and is not addictive. It does not cause sleepiness or sedation.   Important! A teal colored band has been placed on your arm with the date, time and amount of EXPAREL you have received. Please leave this armband in place for the full 96 hours following administration, and then you may remove the band. If you return to the hospital for any reason within 96 hours following the administration of EXPAREL, the armband provides important information that your health care providers to know, and alerts them that you have received this anesthetic.    Possible side effects of EXPAREL: Temporary loss of sensation or ability to move in the area where medication was injected. Nausea, vomiting, constipation Rarely, numbness and tingling in your mouth or lips, lightheadedness, or anxiety may occur. Call your doctor right away if you think you may be experiencing any of these sensations, or if you have other questions regarding possible side effects.  Follow all other discharge instructions given to you by your surgeon or nurse. Eat a healthy diet and drink plenty of water or other fluids.  INSTRUCTIONS FOR AFTER ABDOMINAL SURGERY  You will likely have some questions about what to expect following your operation.  The following information will help you and your family understand what to expect when you are discharged from the hospital.  It is important to follow these guidelines to help  ensure a smooth recovery and reduce complication.  Postoperative instructions include information on: diet, wound care, medications and physical activity.  AFTER SURGERY Expect to go home after the procedure.  In some cases, you may need to spend one night in the hospital for observation.  DIET Abdominal surgery does not require a specific diet.  However, the healthier you eat the better your body will heal. It is important to increasing your protein intake.  This means limiting the foods with sugar and carbohydrates.  Focus on vegetables and some meat.  If you have liposuction during your procedure be sure to drink water.  If your urine is bright yellow, then it is concentrated, and you need to drink more water.  As a general rule after surgery, you should have 8 ounces of water every hour while awake.  If you find you are persistently nauseated or unable to take in liquids let us  know.  NO TOBACCO USE or EXPOSURE.  This will slow your healing process and lead to a wound.  WOUND CARE Leave the binder on at all times except when showering . Use fragrance free soap like Dial, Dove or [place].   After 24 hours you can remove the binder to shower. Once dry apply binder or compressive spanx. No baths, pools or hot tubs for four weeks. We close your incision to leave the smallest and best-looking scar. No ointment or creams on your incisions for four weeks.  No Neosporin (Too many skin reactions).  A few weeks after surgery you can use Mederma and start massaging the scar. We ask you to wear your binder or compressive spanx for the first 6 weeks around the clock, including while sleeping. This provides added comfort and helps reduce the fluid accumulation at the surgery site. NO Ice or heating pads to the operative site.  You have a very high risk of a BURN before you feel the temperature change. Continue to empty, recharge, & record drainage from drains 2-3 times a day, as needed.  ACTIVITY No heavy  lifting until cleared by the doctor.  This usually means no more than a half-gallon of milk.  It is OK to walk and climb stairs. Moving your legs is very important to decrease your risk of a blood clot.  It will also help keep you from getting deconditioned.  Every 1 to 2 hours get up and walk for 5 minutes. This will help with a quicker recovery back to normal.  Let pain be your guide so you don't do too much.  This time is for you to recover.  You will be more comfortable if you sleep and rest with your head elevated either with a few pillows under you or in a recliner.  No stomach sleeping for a three months.  WORK Everyone returns to work at different times. As a rough guide, most people take at least 1 - 2 weeks off prior to returning to work. If you need documentation for your job, give the forms to the front staff at the clinic.  DRIVING Arrange for someone to bring you home from the hospital after your surgery.  You may be able to drive a few days after surgery but not while taking any  narcotics or valium.  BOWEL MOVEMENTS Constipation can occur after anesthesia and while taking pain medication.  It is important to stay ahead for your comfort.  We recommend taking Milk of Magnesia (2 tablespoons; twice a day) while taking the pain pills.  MEDICATIONS You may be prescribed should start after surgery At your preoperative visit for you history and physical you may have been given the following medications: Zofran  4 mg:  This is to treat nausea and vomiting.  You can take this every 6 hours as needed and only if needed. 2.   Oxycodone  5 mg:  This is only to be used after you have taken the Motrin  or the Tylenol . Every 8 hours as needed.   Over the counter Medication to take: Ibuprofen  (Motrin ) 600 mg:  Take this every 6 hours.  If you have additional pain then take 500 mg of the Tylenol  every 8 hours.  Only take the Norco after you have tried these two. MiraLAX or Milk of Magnesia: Take this  according to the bottle if you take the Norco.  WHEN TO CALL Call your surgeon's office if any of the following occur: Fever 101 degrees F or greater Excessive bleeding or fluid from the incision site. Pain that increases over time without aid from the medications Redness, warmth, or pus draining from incision sites Persistent nausea or inability to take in liquids Severe misshapen area that underwent the operation.   NO Tyenol before 6:45pm tonight.

## 2024-03-24 NOTE — Interval H&P Note (Signed)
 History and Physical Interval Note:  No change in exam or indication for surgery All questions answered. Marked for a panniculectomy with her assistance Will proceed at her request  03/24/2024 8:55 AM  Michaela Carr  has presented today for surgery, with the diagnosis of m79.3.  The various methods of treatment have been discussed with the patient and family. After consideration of risks, benefits and other options for treatment, the patient has consented to  Procedure(s): PANNICULECTOMY (N/A) as a surgical intervention.  The patient's history has been reviewed, patient examined, no change in status, stable for surgery.  I have reviewed the patient's chart and labs.  Questions were answered to the patient's satisfaction.     [name]

## 2024-03-24 NOTE — Anesthesia Preprocedure Evaluation (Addendum)
 Anesthesia Evaluation  Patient identified by MRN, date of birth, ID band Patient awake    Reviewed: Allergy & Precautions, H&P , NPO status , Patient's Chart, lab work & pertinent test results  History of Anesthesia Complications Negative for: history of anesthetic complications  Airway Mallampati: II  TM Distance: >3 FB Neck ROM: Full    Dental no notable dental hx.    Pulmonary neg pulmonary ROS, sleep apnea , Patient abstained from smoking., former smoker   Pulmonary exam normal breath sounds clear to auscultation       Cardiovascular (-) Past MI negative cardio ROS Normal cardiovascular exam Rhythm:Regular Rate:Normal     Neuro/Psych  PSYCHIATRIC DISORDERS  Depression    negative neurological ROS  negative psych ROS   GI/Hepatic negative GI ROS, Neg liver ROS, PUD,,,S/p gastric banding   Endo/Other  negative endocrine ROSneg diabetes    Renal/GU negative Renal ROS  negative genitourinary   Musculoskeletal negative musculoskeletal ROS (+)    Abdominal   Peds negative pediatric ROS (+)  Hematology negative hematology ROS (+)   Anesthesia Other Findings   Reproductive/Obstetrics negative OB ROS                              Anesthesia Physical Anesthesia Plan  ASA: 2  Anesthesia Plan: General   Post-op Pain Management:    Induction: Intravenous  PONV Risk Score and Plan: 3 and Ondansetron , Dexamethasone , Midazolam  and Treatment may vary due to age or medical condition  Airway Management Planned: LMA  Additional Equipment: None  Intra-op Plan:   Post-operative Plan: Extubation in OR  Informed Consent: I have reviewed the patients History and Physical, chart, labs and discussed the procedure including the risks, benefits and alternatives for the proposed anesthesia with the patient or authorized representative who has indicated his/her understanding and acceptance.      Dental advisory given  Plan Discussed with: CRNA  Anesthesia Plan Comments:          Anesthesia Quick Evaluation

## 2024-03-24 NOTE — Anesthesia Procedure Notes (Signed)
 Procedure Name: Intubation Date/Time: 03/24/2024 9:54 AM  Performed by: Burnard Rosaline HERO, CRNAPre-anesthesia Checklist: Patient identified, Emergency Drugs available, Suction available and Patient being monitored Patient Re-evaluated:Patient Re-evaluated prior to induction Oxygen Delivery Method: Circle system utilized Preoxygenation: Pre-oxygenation with 100% oxygen Induction Type: IV induction Ventilation: Mask ventilation without difficulty Laryngoscope Size: Mac and 4 Grade View: Grade II Tube type: Oral Tube size: 7.0 mm Number of attempts: 1 Airway Equipment and Method: Stylet and Oral airway Placement Confirmation: ETT inserted through vocal cords under direct vision, positive ETCO2, breath sounds checked- equal and bilateral and CO2 detector Secured at: 23 cm Tube secured with: Tape Dental Injury: Teeth and Oropharynx as per pre-operative assessment

## 2024-03-24 NOTE — Anesthesia Postprocedure Evaluation (Signed)
 Anesthesia Post Note  Patient: Michaela Carr  Procedure(s) Performed: PANNICULECTOMY (Abdomen)     Patient location during evaluation: PACU Anesthesia Type: General Level of consciousness: awake and alert Pain management: pain level controlled Vital Signs Assessment: post-procedure vital signs reviewed and stable Respiratory status: spontaneous breathing, nonlabored ventilation, respiratory function stable and patient connected to nasal cannula oxygen Cardiovascular status: blood pressure returned to baseline and stable Postop Assessment: no apparent nausea or vomiting Anesthetic complications: no   No notable events documented.  Last Vitals:  Vitals:   03/24/24 1315 03/24/24 1340  BP: (!) 145/82 (!) 149/77  Pulse: 70 70  Resp: 13 16  Temp:  (!) 36.2 C  SpO2: 100% 96%    Last Pain:  Vitals:   03/24/24 1340  TempSrc:   PainSc: 3                  Kyair Ditommaso P Staton Markey

## 2024-03-24 NOTE — Interval H&P Note (Signed)
 History and Physical Interval Note:  03/24/2024 8:55 AM  Michaela Carr  has presented today for surgery, with the diagnosis of m79.3.  The various methods of treatment have been discussed with the patient and family. After consideration of risks, benefits and other options for treatment, the patient has consented to  Procedure(s): PANNICULECTOMY (N/A) as a surgical intervention.  The patient's history has been reviewed, patient examined, no change in status, stable for surgery.  I have reviewed the patient's chart and labs.  Questions were answered to the patient's satisfaction.     [name]

## 2024-03-24 NOTE — Op Note (Signed)
 DATE OF OPERATION: 03/24/2024  LOCATION: [facility] operating Room  PREOPERATIVE DIAGNOSIS: Panniculitis  POSTOPERATIVE DIAGNOSIS: Same  PROCEDURE: Panniculectomy  SURGEON: Marinell Birmingham, MD  ASSISTANT: Estefana Peck  EBL: 75 cc  CONDITION: Stable  COMPLICATIONS: None  INDICATION: The patient, Michaela Carr, is a 53 y.o. female born on 11-14-1970, is here for treatment of ongoing rashes on the posterior aspect of the pannus and in the intertriginous regions.   PROCEDURE DETAILS:  The patient was seen prior to surgery and marked.  The IV antibiotics were given. The patient was taken to the operating room and given a general anesthetic. A standard time out was performed and all information was confirmed by those in the room. SCDs were placed.   The abdomen is prepped and draped in usual sterile manner.  A transverse incision was made across the lower portion of the abdomen sharply and the electrocautery was used to dissect the subcutaneous tissues down to the anterior abdominal wall.  The skin of fat was elevated from the anterior abdominal wall in a caudad to cranial manner to the level of the umbilicus.  A point for the counterincision was identified allowed for a tension-free closure.  A second incision was made sharply and the pannus was excised.  The subcutaneous tissues were then inspected for bleeding meticulous hemostasis achieved with the electrocautery.  100 mL of a mixture of Exparel, quarter percent plain Marcaine, saline was injected into the subfascial space over the rectus and external bleak muscles as well as the subcutaneous tissues.  The surgical bed was irrigated with warm normal saline and a Valsalva was held for 8 seconds.  No further bleeding was noted.  219 French round drains were placed in the surgical bed and brought out through separate stab incisions.  Skin edges were tailor tacked in place and the Scarpa's fascia approximated with interrupted 0 PDS sutures.   The dermis was then closed with interrupted and running 3-0 Monocryl sutures and the skin closed with a running 4-0 Monocryl subcuticular stitch.  The incisions were sealed with Dermabond.  The patient was placed in a compressive garment and awakened from anesthesia without incident.  She was transferred to the recovery room in good condition.  All instrument needle and sponge counts were reported as correct and no complications were noted during the procedure. The patient was allowed to wake up and taken to recovery room in stable condition at the end of the case. The family was notified at the end of the case.   The advanced practice practitioner (APP) assisted throughout the case.  The APP was essential in retraction and counter traction when needed to make the case progress smoothly.  This retraction and assistance made it possible to see the tissue plans for the procedure.  The assistance was needed for blood control, tissue re-approximation and assisted with closure of the incision site.

## 2024-03-24 NOTE — Transfer of Care (Signed)
 Immediate Anesthesia Transfer of Care Note  Patient: Michaela Carr  Procedure(s) Performed: PANNICULECTOMY (Abdomen)  Patient Location: PACU  Anesthesia Type:General  Level of Consciousness: awake, alert , and patient cooperative  Airway & Oxygen Therapy: Patient Spontanous Breathing and Patient connected to face mask oxygen  Post-op Assessment: Report given to RN and Post -op Vital signs reviewed and stable  Post vital signs: Reviewed and stable  Last Vitals:  Vitals Value Taken Time  BP 147/84 03/24/24 12:17  Temp 36.4 C 03/24/24 12:17  Pulse 84 03/24/24 12:17  Resp 12 03/24/24 12:17  SpO2 100 % 03/24/24 12:17  Vitals shown include unfiled device data.  Last Pain:  Vitals:   03/24/24 0807  TempSrc: Temporal  PainSc: 0-No pain         Complications: No notable events documented.

## 2024-03-25 ENCOUNTER — Encounter (HOSPITAL_BASED_OUTPATIENT_CLINIC_OR_DEPARTMENT_OTHER): Payer: Self-pay | Admitting: Plastic Surgery

## 2024-03-27 ENCOUNTER — Ambulatory Visit (INDEPENDENT_AMBULATORY_CARE_PROVIDER_SITE_OTHER): Admitting: Physician Assistant

## 2024-03-27 VITALS — BP 131/86 | HR 74 | Ht 67.0 in

## 2024-03-27 DIAGNOSIS — Z9889 Other specified postprocedural states: Secondary | ICD-10-CM

## 2024-03-27 NOTE — Progress Notes (Signed)
 Patient is a 53 year old female s/p panniculectomy performed 03/24/2024 by Dr. Waddell who presents to clinic for postoperative follow-up.  Evidently she called the on-call service over the weekend reporting drainage from the drain tube insertion sites.  Today, patient is accompanied by spouse at bedside.  She states that she tolerating p.o. intake without difficulty.  Voiding urine and BM.  Ambulatory at home.  Denies any chest pain, Diffley breathing, or leg swelling.  She does state that she has some mild discomfort at the surgical site, but tolerating with analgesics.  She is wearing her binder 24/7.  She noticed that the left drain was not draining as much and that there was some fluid coming around the drain tube insertion site.  She was concerned that perhaps it was compromised.  On exam, binder is carefully removed.  Abdomen is soft and nondistended.  No overlying skin changes or significant ecchymoses noted.  Incision CDI throughout.  Drains are intact and functional with light serosanguineous output in bulbs bilaterally.  The Tegaderm dressings over the drain tubes at the insertion site are carefully removed.  Vaseline is placed around the drain tubes followed by 2 x 2 gauze and Medipore tape.  Follow-up as scheduled, sooner if needed.  She looks to be healing nicely and her exam today is benign.  Emphasized the importance of continued activity modifications and compressive garments.

## 2024-03-30 ENCOUNTER — Ambulatory Visit (INDEPENDENT_AMBULATORY_CARE_PROVIDER_SITE_OTHER): Admitting: Plastic Surgery

## 2024-03-30 DIAGNOSIS — Z9889 Other specified postprocedural states: Secondary | ICD-10-CM

## 2024-03-30 NOTE — Progress Notes (Signed)
 Michaela Carr returns today 6 days postop from her panniculectomy.  She is doing well with no complaints.  She is overall happy with the early results.  No fevers chills or discomfort.  She is tolerating a diet and having bowel movements.  She does not have her drain records but believes the left drain is putting out less than the right drain however it is still greater than 30 mL/day.  We discussed drain outputs.  Once her drain is clearly less than 30 mL per 24 hours for 2 days in a row she will call back to have her drain removed.  She understands we will remove 1 drain at a time.  Keep regularly scheduled appointments.

## 2024-04-03 ENCOUNTER — Ambulatory Visit (INDEPENDENT_AMBULATORY_CARE_PROVIDER_SITE_OTHER): Admitting: Student

## 2024-04-03 VITALS — BP 164/84 | HR 86 | Temp 97.6°F

## 2024-04-03 DIAGNOSIS — Z9889 Other specified postprocedural states: Secondary | ICD-10-CM

## 2024-04-03 MED ORDER — DOXYCYCLINE HYCLATE 100 MG PO TABS: 100.0000 mg | ORAL_TABLET | Freq: Two times a day (BID) | ORAL | 0 refills | Status: AC

## 2024-04-03 NOTE — Progress Notes (Signed)
 Patient is a 53 year old female who underwent panniculectomy with Dr. Waddell on 03/24/2024.  Patient is a little over 1 week postop.  she presents to the clinic today for postoperative follow-up.  Patient was last seen in the clinic on 03/30/2024.  At this visit, patient was doing well and was overall happy with early results.  Patient was unsure of her exact drain output, but it was discussed with her that once her drain output was less than 30 cc per 24 hours for 2 days in a row she could call back to have her drain removed.  Today, patient presents with her husband at bedside.  She states that her drain output has been less than 30 cc from each of the drains over the past few days.  She states that the left has been putting out a little bit less than the right.  She reports that she is having some discomfort at the drain sites.  She denies any fevers or chills.  She does report some soreness over the mons.  She otherwise denies any new issues or concerns.  Chaperone present on exam.  On exam, patient sitting upright in no acute distress.  Abdomen is overall soft and nontender.  There does appear to be a little bit of firmness noted to the left inferior/lateral abdomen.  There is no overlying skin changes to this area.  No significant tenderness to palpation.  It does feel consistent with possibly fat necrosis or scar tissue.  It does not appear to be fluctuant.  There is no obvious fluid collection palpated on exam.  Incision overall appears to be intact and healing well.  There does appear to be some mild erythema noted centrally near the incision.  It does not appear to be cellulitic.  Mons pubis is soft, there is no overlying erythema.  JP drains are in place and functioning.  There is minimal serosanguineous drainage in each of the bulbs, with the left drain having slightly darker drainage from the right.  Left JP drain was removed without any difficulty.  Patient tolerated well.  I discussed with  the patient that she does have some mild erythema which may be irritation, but given soreness, recommended we start an antibiotic to cover her.  She expressed understanding to this.  Will send in doxycycline.  Discussed with the patient to monitor the area of firmness to her left abdomen.  Discussed with her she can gently massage this area.  She expressed understanding.  Discussed with patient that she can start applying Vaseline throughout her incisions daily starting this Friday.  Also discussed with her that her left drain site may drain a little bit over the next few days.  Discussed with her she may place gauze and tape over this daily or as needed if saturated.  She expressed understanding.  Discussed with patient to continue with compression at all times.  Patient to follow back up next week.  I instructed her to call if she has any questions or concerns about anything in the meantime.

## 2024-04-05 ENCOUNTER — Encounter: Admitting: Student

## 2024-04-12 ENCOUNTER — Encounter: Payer: Self-pay | Admitting: Surgical

## 2024-04-12 ENCOUNTER — Ambulatory Visit (INDEPENDENT_AMBULATORY_CARE_PROVIDER_SITE_OTHER): Admitting: Surgical

## 2024-04-12 VITALS — BP 155/91 | HR 77 | Ht 67.0 in | Wt 237.4 lb

## 2024-04-12 DIAGNOSIS — Z9889 Other specified postprocedural states: Secondary | ICD-10-CM

## 2024-04-12 NOTE — Progress Notes (Signed)
 Patient is a 53 year old female here for follow-up after panniculectomy with Dr. Waddell on 03/24/2024.  She had her left JP drain removed at her last appointment on 04/03/2024.  She is here today for reevaluation and possible drain removal on the right side.  She reports the right side drain has been having very minimal output on the right side.  She reports about 10 cc or less per 24 hours. She is not having any infectious symptoms today.  She feels as if she has been feeling well without any issues.  Soreness has resolved.  She does not have any ongoing tenderness of the mons area  Chaperone present on exam On exam the majority of her abdominal incision is intact and healing very well.  There is no obvious cellulitic changes.  She does have some slight erythema, but it is not cellulitic.  She does have a small wound centrally that is approximately 3 x 10 mm.  There is no surrounding erythema or cellulitic changes.  No active drainage noted.  Mons pubis area is nontender to palpation.  Right JP drain is in place with about 4 to 5 cc of serous drainage.  A/P:  Patient is doing well, no signs infection or concern on exam.  Right JP drain was removed.  We did discuss importance of avoiding sudden increase in activity.  Did discuss that based on her outputs, it is recommended to remove the drain, however it is possible that she could reaccumulate fluid which could require more intervention such as ultrasound-guided drain placement.  All of her questions were answered to her content.  We will plan to see her back in approximately 2 weeks for reevaluation.  Recommend calling with questions or concerns.     Pictures were obtained of the patient and placed in the chart with the patient's or guardian's permission.

## 2024-04-19 ENCOUNTER — Encounter: Admitting: Student

## 2024-04-28 ENCOUNTER — Encounter: Payer: Self-pay | Admitting: Student

## 2024-04-28 ENCOUNTER — Encounter: Payer: Self-pay | Admitting: *Deleted

## 2024-04-28 ENCOUNTER — Ambulatory Visit: Admitting: Student

## 2024-04-28 VITALS — BP 139/83 | HR 71 | Ht 67.0 in | Wt 238.6 lb

## 2024-04-28 DIAGNOSIS — Z9889 Other specified postprocedural states: Secondary | ICD-10-CM

## 2024-04-28 NOTE — Progress Notes (Signed)
 Patient is a 53 year old female who underwent panniculectomy with Dr. Waddell on 03/24/2024.  Patient is 5 weeks postop.  She presents to the clinic today for postoperative follow-up.  Patient was last seen in the clinic on 04/12/2024.  At the visit, patient reported that her right drain had been putting out very minimal drainage.  Patient reported she was feeling well.  On exam, the majority of the abdominal incision was intact and healing well.  There were no obvious cellulitic changes.  There was some slight erythema, but it was not cellulitic.  There is a small wound centrally that is approximately 3 x 10 mm.  Right JP drain was removed without any difficulty.  Today, patient reports she is doing well.  She states that she still has a small wound centrally which is a little sore from time to time.  She denies any other issues or concerns.  She denies any fevers or chills.  Chaperone present on exam.  On exam, patient sitting upright in no acute distress.  Abdomen is soft and nontender.  There is no overlying erythema, no obvious fluid collections on exam.  There is a small superficial wound with Monocryl suture noted within it centrally to the incision.  There were also a few other areas along the incision where a Monocryl suture was protruding from.  All of the Monocryl suture was cut and removed.  Patient tolerated well.  There were a few other superficial scabs noted throughout the incision.  Remainder of the incision was intact and healing well.  There were no signs of infection on exam.  Recommended that patient continue to apply Vaseline to her incision for another 2 weeks or so.  I discussed with her to gently massage her incision daily.  I discussed with her that once the wounds/scabs have completely resolved, she may transition to scar creams such as Mederma, silicone-based scar creams and tapes.  Patient expressed understanding.  I discussed with the patient that as of next week, she no longer  has to wear compression and may start increasing her activities.  Patient to follow-up as needed.  Instructed her to call if she has any questions or concerns about anything.

## 2024-05-11 ENCOUNTER — Other Ambulatory Visit: Payer: Self-pay | Admitting: Internal Medicine

## 2024-05-17 ENCOUNTER — Ambulatory Visit: Admitting: Emergency Medicine

## 2024-05-17 ENCOUNTER — Encounter: Payer: Self-pay | Admitting: Emergency Medicine

## 2024-05-17 VITALS — BP 180/100 | HR 79 | Temp 98.4°F | Ht 67.0 in | Wt 239.0 lb

## 2024-05-17 DIAGNOSIS — J019 Acute sinusitis, unspecified: Secondary | ICD-10-CM | POA: Diagnosis not present

## 2024-05-17 DIAGNOSIS — B9689 Other specified bacterial agents as the cause of diseases classified elsewhere: Secondary | ICD-10-CM | POA: Insufficient documentation

## 2024-05-17 DIAGNOSIS — S90852A Superficial foreign body, left foot, initial encounter: Secondary | ICD-10-CM | POA: Diagnosis not present

## 2024-05-17 DIAGNOSIS — R030 Elevated blood-pressure reading, without diagnosis of hypertension: Secondary | ICD-10-CM | POA: Diagnosis not present

## 2024-05-17 DIAGNOSIS — R0981 Nasal congestion: Secondary | ICD-10-CM | POA: Insufficient documentation

## 2024-05-17 MED ORDER — AMOXICILLIN-POT CLAVULANATE 875-125 MG PO TABS: 1.0000 | ORAL_TABLET | Freq: Two times a day (BID) | ORAL | 0 refills | Status: AC

## 2024-05-17 NOTE — Assessment & Plan Note (Signed)
 Cardiovascular risks associated with hypertension discussed Advised to monitor blood pressure readings at home daily for the next several weeks and contact the office if numbers persistently abnormal May need to be started on antihypertensive medication Diet and nutrition discussed

## 2024-05-17 NOTE — Assessment & Plan Note (Signed)
 Symptom management discussed Continue over-the-counter nasal sprays Advised to avoid oral decongestants due to elevated blood pressure reading in the office today

## 2024-05-17 NOTE — Assessment & Plan Note (Signed)
 Advised to follow-up with urgent care center or podiatrist as she prefers

## 2024-05-17 NOTE — Patient Instructions (Signed)

## 2024-05-17 NOTE — Progress Notes (Signed)
 Michaela Carr 53 y.o.   Chief Complaint  Patient presents with   Sinus Problem    2 weeks symptoms pt was having a lot congestion and nose bleed and a lot of pressure in the head , pt also would like for her foot to be looked at she think she may have splinter in the left foot     HISTORY OF PRESENT ILLNESS: This is a 53 y.o. female complaining of 2-week history of flulike symptoms mostly sinus congestion Had nosebleed last week.  Also intermittent headaches Also complaining of splinter on left foot No other complaints or medical concerns today.  Sinus Problem Associated symptoms include congestion and headaches. Pertinent negatives include no chills, coughing or shortness of breath.     Prior to Admission medications   Medication Sig Start Date End Date Taking? Authorizing Provider  clotrimazole  (CLOTRIMAZOLE  ANTI-FUNGAL) 1 % cream Apply 1 Application topically 2 (two) times daily. 11/26/23  Yes Burns, Glade PARAS, MD  fluticasone  (FLONASE ) 50 MCG/ACT nasal spray Place 2 sprays into both nostrils daily as needed for allergies or rhinitis. 11/26/23  Yes Burns, Glade PARAS, MD  ibuprofen  (ADVIL ) 800 MG tablet Take 1 tablet (800 mg total) by mouth every 8 (eight) hours as needed for mild pain (pain score 1-3). 11/26/23  Yes Burns, Glade PARAS, MD  ondansetron  (ZOFRAN ) 4 MG tablet Take 1 tablet (4 mg total) by mouth every 8 (eight) hours as needed for up to 20 doses for nausea or vomiting. 03/22/24  Yes Andris Estefana BRAVO, PA-C  oxyCODONE  (ROXICODONE ) 5 MG immediate release tablet Take 1 tablet (5 mg total) by mouth every 6 (six) hours as needed for up to 15 doses for severe pain (pain score 7-10). 03/22/24  Yes Andris Estefana BRAVO, PA-C  triamcinolone  cream (KENALOG ) 0.1 % Apply 1 Application topically 2 (two) times daily. For rash on ebows 11/26/23  Yes Burns, Glade PARAS, MD  WEGOVY  2.4 MG/0.75ML SOAJ SQ injection INJECT 2.4 MG SUBCUTANEOUSLY ONCE A WEEK 05/11/24  Yes Geofm Glade PARAS, MD    No Known  Allergies  Patient Active Problem List   Diagnosis Date Noted   Yeast infection of the skin 11/26/2023   Rash 11/26/2023   Muscle strain of upper back 09/08/2022   Depression 10/31/2021   Pain in joint of right ankle 08/07/2021   Closed bimalleolar fracture of right ankle 08/06/2021   Right shoulder pain 04/12/2021   Acute right-sided thoracic back pain 05/23/2019   Lumbar radiculopathy 03/15/2019   Dyslipidemia 06/10/2017   OSA (obstructive sleep apnea) 04/27/2017   Right foot pain 03/04/2017   H/O laparoscopic adjustable gastric banding 01/26/2017   Prediabetes 03/12/2016   Tingling 03/09/2016   Sciatica of left side without back pain 11/12/2015   Current every day nicotine  vaping 11/12/2015   Allergic rhinitis 11/12/2015   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   ORIF ANKLE FRACTURE Right 08/09/2021   Procedure: OPEN REDUCTION INTERNAL FIXATION (ORIF) ANKLE FRACTURE possible syndesmosis fixation;  Surgeon: Barton Drape, MD;  Location: [facility] OR;  Service: Orthopedics;  Laterality: Right;   PANNICULECTOMY N/A 03/24/2024   Procedure: PANNICULECTOMY;  Surgeon: Waddell Leonce NOVAK, MD;  Location: [facility];  Service: Plastics;  Laterality: N/A;   WISDOM TOOTH EXTRACTION      Social History   Socioeconomic History   Marital status: Married    Spouse name: Not on file   Number of children: Not on file   Years of  education: Not on file   Highest education level: Not on file  Occupational History   Occupation: terster/inspector  Tobacco Use   Smoking status: Former    Current packs/day: 0.50    Average packs/day: 0.5 packs/day for 30.0 years (15.0 ttl pk-yrs)    Types: Cigarettes   Smokeless tobacco: Never   Tobacco comments:    Pt. Already has information on quiting  Vaping Use   Vaping status: Never Used  Substance and Sexual Activity   Alcohol use: Yes    Comment: social wine   Drug use: No   Sexual activity: Yes    Birth control/protection: None    Comment: ablation  Other Topics Concern   Not on file  Social History Narrative   Not on file   Social Drivers of Health   Financial Resource Strain: Not on file  Food Insecurity: Not on file  Transportation Needs: Not on file  Physical Activity: Not on file  Stress: Not on file (04/15/2023)  Social Connections: Not on file  Intimate Partner Violence: Not on file    Family History  Problem Relation Age of Onset   Hyperlipidemia Mother    Hypertension Father    Hyperlipidemia Father    Alcoholism Father    Diabetes Maternal Grandmother      Review of Systems  Constitutional: Negative.  Negative for chills and fever.  HENT:  Positive for congestion.   Respiratory: Negative.  Negative for cough and shortness of breath.   Cardiovascular: Negative.  Negative for chest pain and palpitations.  Gastrointestinal:  Negative for abdominal pain, diarrhea, nausea and vomiting.  Genitourinary: Negative.  Negative for dysuria and hematuria.  Skin: Negative.  Negative for rash.  Neurological:  Positive for headaches.  All other systems reviewed and are negative.   Vitals:   05/17/24 1357 05/17/24 1406  BP: (!) 180/100 (!) 180/100  Pulse: 79   Temp: 98.4 F (36.9 C)   SpO2: 99%     Physical Exam Vitals reviewed.  Constitutional:      Appearance: Normal appearance.  HENT:     Head: Normocephalic.     Right Ear: Tympanic  membrane, ear canal and external ear normal.     Left Ear: Tympanic membrane, ear canal and external ear normal.     Nose: Congestion present.     Mouth/Throat:     Mouth: Mucous membranes are moist.     Pharynx: Oropharynx is clear.  Eyes:     Extraocular Movements: Extraocular movements intact.     Conjunctiva/sclera: Conjunctivae normal.     Pupils: Pupils are equal, round, and reactive to light.  Cardiovascular:     Rate and Rhythm: Normal rate and regular rhythm.     Pulses: Normal pulses.     Heart sounds: Normal heart  sounds.  Pulmonary:     Effort: Pulmonary effort is normal.     Breath sounds: Normal breath sounds.  Musculoskeletal:     Cervical back: No tenderness.  Lymphadenopathy:     Cervical: No cervical adenopathy.  Skin:    General: Skin is warm and dry.     Capillary Refill: Capillary refill takes less than 2 seconds.     Comments: Foreign body reaction plantar surface mid left foot  Neurological:     General: No focal deficit present.     Mental Status: She is alert and oriented to person, place, and time.  Psychiatric:        Mood and Affect: Mood normal.        Behavior: Behavior normal.      ASSESSMENT & PLAN: Problem List Items Addressed This Visit       Respiratory   Acute bacterial sinusitis - Primary   Upper viral respiratory infection now with secondary bacterial sinus infection.  Clinically stable.  No red flag signs or symptoms. Symptom management discussed Recommend Augmentin 875 mg twice a day for 7 days Advised to contact the office if no better or worse during the next several days      Relevant Medications   amoxicillin -clavulanate (AUGMENTIN) 875-125 MG tablet   Sinus congestion   Symptom management discussed Continue over-the-counter nasal sprays Advised to avoid oral decongestants due to elevated blood pressure reading in the office today        Other   Elevated blood pressure reading   Cardiovascular risks associated with  hypertension discussed Advised to monitor blood pressure readings at home daily for the next several weeks and contact the office if numbers persistently abnormal May need to be started on antihypertensive medication Diet and nutrition discussed      Splinter of left foot   Advised to follow-up with urgent care center or podiatrist as she prefers      Patient Instructions  Sinus Infection, Adult A sinus infection is soreness and swelling (inflammation) of your sinuses. Sinuses are hollow spaces in the bones around your face. They are located: Around your eyes. In the middle of your forehead. Behind your nose. In your cheekbones. Your sinuses and nasal passages are lined with a fluid called mucus. Mucus drains out of your sinuses. Swelling can trap mucus in your sinuses. This lets germs (bacteria, virus, or fungus) grow, which leads to infection. Most of the time, this condition is caused by a virus. What are the causes? Allergies. Asthma. Germs. Things that block your nose or sinuses. Growths in the nose (nasal polyps). Chemicals or irritants in the air. A fungus. This is rare. What increases the risk? Having a weak body defense system (immune system). Doing a lot of swimming or diving. Using nasal sprays too much. Smoking. What are the signs or symptoms? The main symptoms of this condition are pain and a feeling of pressure around the sinuses. Other symptoms include: Stuffy nose (congestion). This may make it hard to breathe through your nose. Runny nose (drainage). Soreness, swelling, and warmth in the sinuses. A cough that may get worse at night. Being unable to smell and taste. Mucus that collects in the throat or the back of the nose (postnasal drip). This may cause a sore throat or bad breath. Being very tired (fatigued). A fever. How is this diagnosed? Your symptoms. Your medical history. A physical exam. Tests to find out if your condition is short-term (acute)  or long-term (chronic).  Your doctor may: Check your nose for growths (polyps). Check your sinuses using a tool that has a light on one end (endoscope). Check for allergies or germs. Do imaging tests, such as an MRI or CT scan. How is this treated? Treatment for this condition depends on the cause and whether it is short-term or long-term. If caused by a virus, your symptoms should go away on their own within 10 days. You may be given medicines to relieve symptoms. They include: Medicines that shrink swollen tissue in the nose. A spray that treats swelling of the nostrils. Rinses that help get rid of thick mucus in your nose (nasal saline washes). Medicines that treat allergies (antihistamines). Over-the-counter pain relievers. If caused by bacteria, your doctor may wait to see if you will get better without treatment. You may be given antibiotic medicine if you have: A very bad infection. A weak body defense system. If caused by growths in the nose, surgery may be needed. Follow these instructions at home: Medicines Take, use, or apply over-the-counter and prescription medicines only as told by your doctor. These may include nasal sprays. If you were prescribed an antibiotic medicine, take it as told by your doctor. Do not stop taking it even if you start to feel better. Hydrate and humidify  Drink enough water to keep your pee (urine) pale yellow. Use a cool mist humidifier to keep the humidity level in your home above 50%. Breathe in steam for 10-15 minutes, 3-4 times a day, or as told by your doctor. You can do this in the bathroom while a hot shower is running. Try not to spend time in cool or dry air. Rest Rest as much as you can. Sleep with your head raised (elevated). Make sure you get enough sleep each night. General instructions  Put a warm, moist washcloth on your face 3-4 times a day, or as often as told by your doctor. Use nasal saline washes as often as told by your  doctor. Wash your hands often with soap and water. If you cannot use soap and water, use hand sanitizer. Do not smoke. Avoid being around people who are smoking (secondhand smoke). Keep all follow-up visits. Contact a doctor if: You have a fever. Your symptoms get worse. Your symptoms do not get better within 10 days. Get help right away if: You have a very bad headache. You cannot stop vomiting. You have very bad pain or swelling around your face or eyes. You have trouble seeing. You feel confused. Your neck is stiff. You have trouble breathing. These symptoms may be an emergency. Get help right away. Call 911. Do not wait to see if the symptoms will go away. Do not drive yourself to the hospital. Summary A sinus infection is swelling of your sinuses. Sinuses are hollow spaces in the bones around your face. This condition is caused by tissues in your nose that become inflamed or swollen. This traps germs. These can lead to infection. If you were prescribed an antibiotic medicine, take it as told by your doctor. Do not stop taking it even if you start to feel better. Keep all follow-up visits. This information is not intended to replace advice given to you by your health care provider. Make sure you discuss any questions you have with your health care provider. Document Revised: 04/29/2021 Document Reviewed: 04/29/2021 Elsevier Patient Education  2024 Elsevier Inc.     Emil Schaumann, MD [facility]

## 2024-05-17 NOTE — Assessment & Plan Note (Signed)
 Upper viral respiratory infection now with secondary bacterial sinus infection.  Clinically stable.  No red flag signs or symptoms. Symptom management discussed Recommend Augmentin 875 mg twice a day for 7 days Advised to contact the office if no better or worse during the next several days

## 2024-05-18 DIAGNOSIS — W458XXA Other foreign body or object entering through skin, initial encounter: Secondary | ICD-10-CM | POA: Diagnosis not present

## 2024-05-18 DIAGNOSIS — S90852A Superficial foreign body, left foot, initial encounter: Secondary | ICD-10-CM | POA: Diagnosis not present

## 2024-06-27 ENCOUNTER — Encounter: Payer: Self-pay | Admitting: Internal Medicine

## 2024-06-28 ENCOUNTER — Other Ambulatory Visit: Payer: Self-pay

## 2024-06-28 MED ORDER — WEGOVY 2.4 MG/0.75ML ~~LOC~~ SOAJ: 2.4000 mg | SUBCUTANEOUS | 0 refills | Status: AC

## 2024-07-04 ENCOUNTER — Telehealth: Payer: Self-pay

## 2024-07-06 ENCOUNTER — Telehealth: Payer: Self-pay

## 2024-07-06 ENCOUNTER — Other Ambulatory Visit (HOSPITAL_COMMUNITY): Payer: Self-pay

## 2024-07-06 NOTE — Telephone Encounter (Signed)
 Pharmacy Patient Advocate Encounter  Received notification from [facility] that Prior Authorization for Wegovy  2.4mg /0.24ml has been CANCELLED due to patient will pay 100% of a discounted price for this medication.

## 2024-07-06 NOTE — Telephone Encounter (Signed)
 Pharmacy Patient Advocate Encounter   Received notification from Pt Calls Messages that prior authorization for Wegovy  2.4mg /0.25ml is required/requested.   Insurance verification completed.   The patient is insured through [employer].   Per test claim: PA required; PA submitted to above mentioned insurance via Latent Key/confirmation #/[id number] Status is pending

## 2024-07-14 ENCOUNTER — Telehealth: Payer: Self-pay

## 2024-07-14 NOTE — Telephone Encounter (Signed)
 Copied from CRM [id number]. Topic: Appointments - Scheduling Inquiry for Clinic >> Jul 14, 2024 11:27 AM Harlene ORN wrote: Reason for CRM: Patient called. Is interested in having her husband establish care with Dr. Glade Hope. Please call back the patient to schedule her husband when there are openings available.

## 2024-07-18 ENCOUNTER — Ambulatory Visit: Admitting: Internal Medicine
